# Patient Record
Sex: Female | Born: 1965 | Race: White | Hispanic: No | Marital: Married | Smoking: Never smoker
Health system: Southern US, Community
[De-identification: ages and names within clinical notes are randomized; demographics above are authoritative.]

## PROBLEM LIST (undated history)

## (undated) DIAGNOSIS — F419 Anxiety disorder, unspecified: Secondary | ICD-10-CM

## (undated) DIAGNOSIS — E7800 Pure hypercholesterolemia, unspecified: Secondary | ICD-10-CM

## (undated) DIAGNOSIS — H538 Other visual disturbances: Secondary | ICD-10-CM

## (undated) DIAGNOSIS — R42 Dizziness and giddiness: Secondary | ICD-10-CM

## (undated) DIAGNOSIS — R740 Nonspecific elevation of levels of transaminase and lactic acid dehydrogenase [LDH]: Secondary | ICD-10-CM

## (undated) HISTORY — DX: Nonspecific elevation of levels of transaminase and lactic acid dehydrogenase (ldh): R74.0

## (undated) HISTORY — DX: Pure hypercholesterolemia, unspecified: E78.00

## (undated) HISTORY — DX: Anxiety disorder, unspecified: F41.9

## (undated) HISTORY — PX: AUGMENTATION MAMMAPLASTY: SUR837

## (undated) HISTORY — DX: Other visual disturbances: H53.8

## (undated) HISTORY — DX: Dizziness and giddiness: R42

## (undated) HISTORY — PX: KNEE ARTHROSCOPY: SUR90

---

## 2018-01-20 ENCOUNTER — Encounter: Payer: Self-pay | Admitting: Obstetrics and Gynecology

## 2018-02-10 DIAGNOSIS — R7401 Elevation of levels of liver transaminase levels: Secondary | ICD-10-CM

## 2018-02-10 HISTORY — DX: Elevation of levels of liver transaminase levels: R74.01

## 2018-03-01 ENCOUNTER — Encounter: Payer: Self-pay | Admitting: Obstetrics and Gynecology

## 2018-03-03 ENCOUNTER — Other Ambulatory Visit: Payer: Self-pay

## 2018-03-03 ENCOUNTER — Encounter: Payer: Self-pay | Admitting: Obstetrics and Gynecology

## 2018-03-03 ENCOUNTER — Ambulatory Visit (INDEPENDENT_AMBULATORY_CARE_PROVIDER_SITE_OTHER): Payer: Commercial Managed Care - PPO | Admitting: Obstetrics and Gynecology

## 2018-03-03 VITALS — BP 100/62 | HR 70 | Resp 16 | Wt 129.0 lb

## 2018-03-03 DIAGNOSIS — R740 Nonspecific elevation of levels of transaminase and lactic acid dehydrogenase [LDH]: Secondary | ICD-10-CM | POA: Diagnosis not present

## 2018-03-03 DIAGNOSIS — Z23 Encounter for immunization: Secondary | ICD-10-CM | POA: Diagnosis not present

## 2018-03-03 DIAGNOSIS — Z01419 Encounter for gynecological examination (general) (routine) without abnormal findings: Secondary | ICD-10-CM | POA: Diagnosis not present

## 2018-03-03 DIAGNOSIS — R7401 Elevation of levels of liver transaminase levels: Secondary | ICD-10-CM

## 2018-03-03 NOTE — Patient Instructions (Signed)

## 2018-03-03 NOTE — Progress Notes (Signed)
53 y.o. G2P2 Married Caucasian/Brazilian female here to establish care.    Patient complaining of hair loss, hot flashes, mood swings and decreased libido. Taking biotin, and hair improved.  Did IM implant of hormone and felt very anxious months ago.   Had testosterone in it.  She tried a transdermal estrogen patch and had nausea.   Not sleeping well.   No vaginal pain with intercourse.  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   PCP: None    Patient's last menstrual period was 12/11/2017.     Prior LMP 10/2017.    Sexually active: Yes.   female The current method of family planning is none.    Exercising: No.   Smoker:  no  Health Maintenance: Pap:  10/2017 normal per patient - Dr. Shellee Milo. History of abnormal Pap:  no MMG:  02/2017 normal per patient Colonoscopy:  2017 polyps;next due 5 years BMD:   n/a  Result  n/a TDaP:  Unsure. Thinks it is at least 9 years or more.  Gardasil:   no HIV: 5 years ago--Neg Hep C: 5 years ago--Neg Screening Labs:  ---- Flu vaccine:  Done.    reports that she has never smoked. She has never used smokeless tobacco. She reports current alcohol use of about 3.0 standard drinks of alcohol per week. She reports that she does not use drugs.  History reviewed. No pertinent past medical history.  Past Surgical History:  Procedure Laterality Date  . AUGMENTATION MAMMAPLASTY     Silicone implants  . KNEE ARTHROSCOPY Left     Current Outpatient Medications  Medication Sig Dispense Refill  . Biotin 1 MG CAPS Take 1 capsule by mouth daily.    . Multiple Vitamin (MULTIVITAMIN) capsule Take 1 capsule by mouth daily.     No current facility-administered medications for this visit.     Family History  Problem Relation Age of Onset  . Diabetes Sister     Review of Systems  All other systems reviewed and are negative.   Exam:   BP 100/62 (BP Location: Right Arm, Patient Position: Sitting, Cuff Size: Normal)   Pulse 70   Resp 16   Wt  129 lb (58.5 kg)   LMP 12/11/2017     General appearance: alert, cooperative and appears stated age Head: Normocephalic, without obvious abnormality, atraumatic Neck: no adenopathy, supple, symmetrical, trachea midline and thyroid normal to inspection and palpation Lungs: clear to auscultation bilaterally Breasts: consistent with bilateral implants, normal appearance, no masses or tenderness, No nipple retraction or dimpling, No nipple discharge or bleeding, No axillary or supraclavicular adenopathy Heart: regular rate and rhythm Abdomen: soft, non-tender; no masses, no organomegaly Extremities: extremities normal, atraumatic, no cyanosis or edema Skin: Skin color, texture, turgor normal. No rashes or lesions Lymph nodes: Cervical, supraclavicular, and axillary nodes normal. No abnormal inguinal nodes palpated Neurologic: Grossly normal  Pelvic: External genitalia:  no lesions              Urethra:  normal appearing urethra with no masses, tenderness or lesions              Bartholins and Skenes: normal                 Vagina: normal appearing vagina with normal color and discharge, no lesions              Cervix: no lesions              Pap taken: No. Bimanual Exam:  Uterus:  normal size, contour, position, consistency, mobility, non-tender              Adnexa: no mass, fullness, tenderness              Rectal exam: Yes.  .  Confirms.              Anus:  normal sphincter tone, no lesions  Chaperone was present for exam.  Assessment:   Well woman visit with normal exam. Menopausal symptoms.  Intolerance to hormonal therapies.  Breast implants.   Plan: Mammogram screening. Recommended self breast awareness. Pap and HR HPV as above. Guidelines for Calcium, Vitamin D, regular exercise program including cardiovascular and weight bearing exercise. She will return to her exercise routine.  Routine labs.  I discussed menopause and gave her a pamphlet about this.  She may try  Estroven.  Follow up annually and prn.  After visit summary provided.

## 2018-03-04 LAB — COMPREHENSIVE METABOLIC PANEL
ALBUMIN: 4.6 g/dL (ref 3.8–4.9)
ALT: 38 IU/L — ABNORMAL HIGH (ref 0–32)
AST: 33 IU/L (ref 0–40)
Albumin/Globulin Ratio: 1.9 (ref 1.2–2.2)
Alkaline Phosphatase: 80 IU/L (ref 39–117)
BUN / CREAT RATIO: 19 (ref 9–23)
BUN: 14 mg/dL (ref 6–24)
Bilirubin Total: 0.4 mg/dL (ref 0.0–1.2)
CO2: 20 mmol/L (ref 20–29)
Calcium: 9.9 mg/dL (ref 8.7–10.2)
Chloride: 102 mmol/L (ref 96–106)
Creatinine, Ser: 0.72 mg/dL (ref 0.57–1.00)
GFR calc Af Amer: 111 mL/min/{1.73_m2} (ref >59)
GFR calc non Af Amer: 97 mL/min/{1.73_m2} (ref >59)
GLUCOSE: 87 mg/dL (ref 65–99)
Globulin, Total: 2.4 g/dL (ref 1.5–4.5)
Potassium: 4.3 mmol/L (ref 3.5–5.2)
Sodium: 141 mmol/L (ref 134–144)
Total Protein: 7 g/dL (ref 6.0–8.5)

## 2018-03-04 LAB — LIPID PANEL
Chol/HDL Ratio: 1.9 ratio (ref 0.0–4.4)
Cholesterol, Total: 210 mg/dL — ABNORMAL HIGH (ref 100–199)
HDL: 108 mg/dL (ref >39)
LDL Calculated: 84 mg/dL (ref 0–99)
Triglycerides: 92 mg/dL (ref 0–149)
VLDL Cholesterol Cal: 18 mg/dL (ref 5–40)

## 2018-03-04 LAB — CBC
Hematocrit: 40.8 % (ref 34.0–46.6)
Hemoglobin: 14.2 g/dL (ref 11.1–15.9)
MCH: 32.5 pg (ref 26.6–33.0)
MCHC: 34.8 g/dL (ref 31.5–35.7)
MCV: 93 fL (ref 79–97)
Platelets: 319 10*3/uL (ref 150–450)
RBC: 4.37 x10E6/uL (ref 3.77–5.28)
RDW: 12.3 % (ref 11.7–15.4)
WBC: 6.6 10*3/uL (ref 3.4–10.8)

## 2018-03-04 LAB — TSH: TSH: 2.23 u[IU]/mL (ref 0.450–4.500)

## 2018-03-04 LAB — VITAMIN D 25 HYDROXY (VIT D DEFICIENCY, FRACTURES): Vit D, 25-Hydroxy: 54.8 ng/mL (ref 30.0–100.0)

## 2018-03-05 ENCOUNTER — Encounter: Payer: Self-pay | Admitting: Obstetrics and Gynecology

## 2018-03-05 NOTE — Addendum Note (Signed)
Addended by: Ardell Isaacs, Lavine Hargrove E on: 03/05/2018 05:30 AM   Modules accepted: Orders

## 2018-03-08 ENCOUNTER — Telehealth: Payer: Self-pay | Admitting: Obstetrics and Gynecology

## 2018-03-08 NOTE — Telephone Encounter (Signed)
-----   Message from Patton Salles, MD sent at 03/05/2018  5:29 AM EST ----- Please report results to patient.   Cholesterol is elevated due to her HDL, good cholesterol, being so high.  Cholesterol ratios are excellent.   Her AST liver enzyme is elevated and needs a repeat in 6 weeks.  She may be able to lower this by reducing alcohol or Tylenol use.   Her vit D, thyroid, and blood counts are normal. I will place an order, and please schedule a lab visit.   I am highlighting this result so you know to contact the patient.

## 2018-03-08 NOTE — Telephone Encounter (Signed)
Patient saw her lab results in MyChart and has questions.

## 2018-03-08 NOTE — Telephone Encounter (Signed)
Spoke with patient, advised as seen below per Dr. Edward Jolly. Lab appt scheduled for 3*6/20 at 3:30pm.  Patient verbalizes understanding and is agreeable.   Encounter closed.

## 2018-03-08 NOTE — Telephone Encounter (Signed)
Patient is calling regarding results.

## 2018-04-05 ENCOUNTER — Telehealth: Payer: Self-pay | Admitting: Obstetrics and Gynecology

## 2018-04-05 NOTE — Telephone Encounter (Signed)
Left message to call Arriel Victor, RN at [facility] [phone].   

## 2018-04-05 NOTE — Telephone Encounter (Signed)
Spoke with patient. Patient reports brown vaginal d/c that started on 2/23. Patient is postmenopausal. Denies heavy bleeding, pelvic pain, N/V, fever/chills, urinary symptoms or vaginal odor. Currently out of town, will return Thursday night. Recommended OV for further evaluation, patient declined OV on 2/28, states she will be traveling to [place] that day. OV scheduled for 3/2 at 3:30pm with Dr. Edward Jolly.   Routing to provider for final review. Patient is agreeable to disposition. Will close encounter.

## 2018-04-05 NOTE — Telephone Encounter (Signed)
Patient called and said she is experiencing some "brown" bleeding vaginally and she is post menopausal.   Last seen: 03/03/18

## 2018-04-05 NOTE — Telephone Encounter (Signed)
Patient returned call to nurse Jill. °

## 2018-04-12 ENCOUNTER — Ambulatory Visit: Payer: Self-pay | Admitting: Obstetrics and Gynecology

## 2018-04-12 ENCOUNTER — Encounter: Payer: Self-pay | Admitting: Obstetrics and Gynecology

## 2018-04-12 ENCOUNTER — Telehealth: Payer: Self-pay | Admitting: Obstetrics and Gynecology

## 2018-04-12 NOTE — Telephone Encounter (Signed)
Called patient about missed appointment today. No answer and voicemail is full.

## 2018-04-12 NOTE — Progress Notes (Deleted)
GYNECOLOGY  VISIT   HPI: 53 y.o.   Married  Caucasian/Brazilian  female   G2P2 with No LMP recorded.   here for postmenopausal bleeding.     GYNECOLOGIC HISTORY: No LMP recorded. Contraception:  none Menopausal hormone therapy:  none Last mammogram: 02/2017 normal per patient Last pap smear:  10/2017 normal per patient - Dr.Rivaard        OB History    Gravida  2   Para  2   Term      Preterm      AB      Living  2     SAB      TAB      Ectopic      Multiple      Live Births                 There are no active problems to display for this patient.   Past Medical History:  Diagnosis Date  . Elevated ALT measurement 2020    Past Surgical History:  Procedure Laterality Date  . AUGMENTATION MAMMAPLASTY     Silicone implants  . KNEE ARTHROSCOPY Left     Current Outpatient Medications  Medication Sig Dispense Refill  . Biotin 1 MG CAPS Take 1 capsule by mouth daily.    . Multiple Vitamin (MULTIVITAMIN) capsule Take 1 capsule by mouth daily.     No current facility-administered medications for this visit.      ALLERGIES: Patient has no known allergies.  Family History  Problem Relation Age of Onset  . Diabetes Sister     Social History   Socioeconomic History  . Marital status: Married    Spouse name: Not on file  . Number of children: Not on file  . Years of education: Not on file  . Highest education level: Not on file  Occupational History  . Not on file  Social Needs  . Financial resource strain: Not on file  . Food insecurity:    Worry: Not on file    Inability: Not on file  . Transportation needs:    Medical: Not on file    Non-medical: Not on file  Tobacco Use  . Smoking status: Never Smoker  . Smokeless tobacco: Never Used  Substance and Sexual Activity  . Alcohol use: Yes    Alcohol/week: 3.0 standard drinks    Types: 3 Glasses of wine per week  . Drug use: Never  . Sexual activity: Yes    Birth control/protection:  None  Lifestyle  . Physical activity:    Days per week: Not on file    Minutes per session: Not on file  . Stress: Not on file  Relationships  . Social connections:    Talks on phone: Not on file    Gets together: Not on file    Attends religious service: Not on file    Active member of club or organization: Not on file    Attends meetings of clubs or organizations: Not on file    Relationship status: Not on file  . Intimate partner violence:    Fear of current or ex partner: Not on file    Emotionally abused: Not on file    Physically abused: Not on file    Forced sexual activity: Not on file  Other Topics Concern  . Not on file  Social History Narrative  . Not on file    Review of Systems  PHYSICAL EXAMINATION:    There  were no vitals taken for this visit.    General appearance: alert, cooperative and appears stated age Head: Normocephalic, without obvious abnormality, atraumatic Neck: no adenopathy, supple, symmetrical, trachea midline and thyroid normal to inspection and palpation Lungs: clear to auscultation bilaterally Breasts: normal appearance, no masses or tenderness, No nipple retraction or dimpling, No nipple discharge or bleeding, No axillary or supraclavicular adenopathy Heart: regular rate and rhythm Abdomen: soft, non-tender, no masses,  no organomegaly Extremities: extremities normal, atraumatic, no cyanosis or edema Skin: Skin color, texture, turgor normal. No rashes or lesions Lymph nodes: Cervical, supraclavicular, and axillary nodes normal. No abnormal inguinal nodes palpated Neurologic: Grossly normal  Pelvic: External genitalia:  no lesions              Urethra:  normal appearing urethra with no masses, tenderness or lesions              Bartholins and Skenes: normal                 Vagina: normal appearing vagina with normal color and discharge, no lesions              Cervix: no lesions                Bimanual Exam:  Uterus:  normal size,  contour, position, consistency, mobility, non-tender              Adnexa: no mass, fullness, tenderness              Rectal exam: {yes no:314532}.  Confirms.              Anus:  normal sphincter tone, no lesions  Chaperone was present for exam.  ASSESSMENT     PLAN     An After Visit Summary was printed and given to the patient.  ______ minutes face to face time of which over 50% was spent in counseling.

## 2018-04-12 NOTE — Telephone Encounter (Signed)
Patient called regarding her missed appointment today. She had a dead battery in her car and unable to make it to her appointment. She did reschedule to 04/15/18.

## 2018-04-13 ENCOUNTER — Other Ambulatory Visit: Payer: Commercial Managed Care - PPO

## 2018-04-15 ENCOUNTER — Ambulatory Visit: Payer: Self-pay | Admitting: Obstetrics and Gynecology

## 2018-04-15 NOTE — Progress Notes (Deleted)
GYNECOLOGY  VISIT   HPI: 53 y.o.   Married  Caucasian  female   G2P2 with No LMP recorded.   here for     GYNECOLOGIC HISTORY: No LMP recorded. Contraception:  *** Menopausal hormone therapy:  none Last mammogram:  02/2017 normal per patient Last pap smear: 10/2017 normal per patient - Dr. Shellee Milo        OB History    Gravida  2   Para  2   Term      Preterm      AB      Living  2     SAB      TAB      Ectopic      Multiple      Live Births                 There are no active problems to display for this patient.   Past Medical History:  Diagnosis Date  . Elevated ALT measurement 2020    Past Surgical History:  Procedure Laterality Date  . AUGMENTATION MAMMAPLASTY     Silicone implants  . KNEE ARTHROSCOPY Left     Current Outpatient Medications  Medication Sig Dispense Refill  . Biotin 1 MG CAPS Take 1 capsule by mouth daily.    . Multiple Vitamin (MULTIVITAMIN) capsule Take 1 capsule by mouth daily.     No current facility-administered medications for this visit.      ALLERGIES: Patient has no known allergies.  Family History  Problem Relation Age of Onset  . Diabetes Sister     Social History   Socioeconomic History  . Marital status: Married    Spouse name: Not on file  . Number of children: Not on file  . Years of education: Not on file  . Highest education level: Not on file  Occupational History  . Not on file  Social Needs  . Financial resource strain: Not on file  . Food insecurity:    Worry: Not on file    Inability: Not on file  . Transportation needs:    Medical: Not on file    Non-medical: Not on file  Tobacco Use  . Smoking status: Never Smoker  . Smokeless tobacco: Never Used  Substance and Sexual Activity  . Alcohol use: Yes    Alcohol/week: 3.0 standard drinks    Types: 3 Glasses of wine per week  . Drug use: Never  . Sexual activity: Yes    Birth control/protection: None  Lifestyle  . Physical  activity:    Days per week: Not on file    Minutes per session: Not on file  . Stress: Not on file  Relationships  . Social connections:    Talks on phone: Not on file    Gets together: Not on file    Attends religious service: Not on file    Active member of club or organization: Not on file    Attends meetings of clubs or organizations: Not on file    Relationship status: Not on file  . Intimate partner violence:    Fear of current or ex partner: Not on file    Emotionally abused: Not on file    Physically abused: Not on file    Forced sexual activity: Not on file  Other Topics Concern  . Not on file  Social History Narrative  . Not on file    Review of Systems  PHYSICAL EXAMINATION:    There were  no vitals taken for this visit.    General appearance: alert, cooperative and appears stated age Head: Normocephalic, without obvious abnormality, atraumatic Neck: no adenopathy, supple, symmetrical, trachea midline and thyroid normal to inspection and palpation Lungs: clear to auscultation bilaterally Breasts: normal appearance, no masses or tenderness, No nipple retraction or dimpling, No nipple discharge or bleeding, No axillary or supraclavicular adenopathy Heart: regular rate and rhythm Abdomen: soft, non-tender, no masses,  no organomegaly Extremities: extremities normal, atraumatic, no cyanosis or edema Skin: Skin color, texture, turgor normal. No rashes or lesions Lymph nodes: Cervical, supraclavicular, and axillary nodes normal. No abnormal inguinal nodes palpated Neurologic: Grossly normal  Pelvic: External genitalia:  no lesions              Urethra:  normal appearing urethra with no masses, tenderness or lesions              Bartholins and Skenes: normal                 Vagina: normal appearing vagina with normal color and discharge, no lesions              Cervix: no lesions                Bimanual Exam:  Uterus:  normal size, contour, position, consistency,  mobility, non-tender              Adnexa: no mass, fullness, tenderness              Rectal exam: {yes no:314532}.  Confirms.              Anus:  normal sphincter tone, no lesions  Chaperone was present for exam.  ASSESSMENT     PLAN     An After Visit Summary was printed and given to the patient.  ______ minutes face to face time of which over 50% was spent in counseling.

## 2018-04-16 ENCOUNTER — Other Ambulatory Visit: Payer: Self-pay

## 2018-04-16 ENCOUNTER — Ambulatory Visit: Payer: Self-pay | Admitting: Obstetrics and Gynecology

## 2018-04-23 ENCOUNTER — Telehealth: Payer: Self-pay | Admitting: Obstetrics and Gynecology

## 2018-04-23 ENCOUNTER — Ambulatory Visit: Payer: Commercial Managed Care - PPO | Admitting: Obstetrics and Gynecology

## 2018-04-23 ENCOUNTER — Encounter: Payer: Self-pay | Admitting: Obstetrics and Gynecology

## 2018-04-23 NOTE — Progress Notes (Deleted)
GYNECOLOGY  VISIT   HPI: 53 y.o.   Married  Caucasian  female   G2P2 with No LMP recorded.   here for PMB    GYNECOLOGIC HISTORY: No LMP recorded. Contraception:  none Menopausal hormone therapy:  none Last mammogram:  02/2017 normal per patient Last pap smear:   10/2017 normal per patient - Dr. Shellee Milo        OB History    Gravida  2   Para  2   Term      Preterm      AB      Living  2     SAB      TAB      Ectopic      Multiple      Live Births                 There are no active problems to display for this patient.   Past Medical History:  Diagnosis Date  . Elevated ALT measurement 2020    Past Surgical History:  Procedure Laterality Date  . AUGMENTATION MAMMAPLASTY     Silicone implants  . KNEE ARTHROSCOPY Left     Current Outpatient Medications  Medication Sig Dispense Refill  . Biotin 1 MG CAPS Take 1 capsule by mouth daily.    . Multiple Vitamin (MULTIVITAMIN) capsule Take 1 capsule by mouth daily.     No current facility-administered medications for this visit.      ALLERGIES: Patient has no known allergies.  Family History  Problem Relation Age of Onset  . Diabetes Sister     Social History   Socioeconomic History  . Marital status: Married    Spouse name: Not on file  . Number of children: Not on file  . Years of education: Not on file  . Highest education level: Not on file  Occupational History  . Not on file  Social Needs  . Financial resource strain: Not on file  . Food insecurity:    Worry: Not on file    Inability: Not on file  . Transportation needs:    Medical: Not on file    Non-medical: Not on file  Tobacco Use  . Smoking status: Never Smoker  . Smokeless tobacco: Never Used  Substance and Sexual Activity  . Alcohol use: Yes    Alcohol/week: 3.0 standard drinks    Types: 3 Glasses of wine per week  . Drug use: Never  . Sexual activity: Yes    Birth control/protection: None  Lifestyle  . Physical  activity:    Days per week: Not on file    Minutes per session: Not on file  . Stress: Not on file  Relationships  . Social connections:    Talks on phone: Not on file    Gets together: Not on file    Attends religious service: Not on file    Active member of club or organization: Not on file    Attends meetings of clubs or organizations: Not on file    Relationship status: Not on file  . Intimate partner violence:    Fear of current or ex partner: Not on file    Emotionally abused: Not on file    Physically abused: Not on file    Forced sexual activity: Not on file  Other Topics Concern  . Not on file  Social History Narrative  . Not on file    Review of Systems  PHYSICAL EXAMINATION:  There were no vitals taken for this visit.    General appearance: alert, cooperative and appears stated age Head: Normocephalic, without obvious abnormality, atraumatic Neck: no adenopathy, supple, symmetrical, trachea midline and thyroid normal to inspection and palpation Lungs: clear to auscultation bilaterally Breasts: normal appearance, no masses or tenderness, No nipple retraction or dimpling, No nipple discharge or bleeding, No axillary or supraclavicular adenopathy Heart: regular rate and rhythm Abdomen: soft, non-tender, no masses,  no organomegaly Extremities: extremities normal, atraumatic, no cyanosis or edema Skin: Skin color, texture, turgor normal. No rashes or lesions Lymph nodes: Cervical, supraclavicular, and axillary nodes normal. No abnormal inguinal nodes palpated Neurologic: Grossly normal  Pelvic: External genitalia:  no lesions              Urethra:  normal appearing urethra with no masses, tenderness or lesions              Bartholins and Skenes: normal                 Vagina: normal appearing vagina with normal color and discharge, no lesions              Cervix: no lesions                Bimanual Exam:  Uterus:  normal size, contour, position, consistency,  mobility, non-tender              Adnexa: no mass, fullness, tenderness              Rectal exam: {yes no:314532}.  Confirms.              Anus:  normal sphincter tone, no lesions  Chaperone was present for exam.  ASSESSMENT     PLAN     An After Visit Summary was printed and given to the patient.  ______ minutes face to face time of which over 50% was spent in counseling.

## 2018-04-23 NOTE — Telephone Encounter (Signed)
Patient canceled her a appointment today for  hepatic panel and  PMB, brown d/c. Patient stated it would be impossible for her to make this afternoon. She said "I am no longer having this problem and will call if I need to return".

## 2018-06-23 ENCOUNTER — Other Ambulatory Visit: Payer: Self-pay | Admitting: Nurse Practitioner

## 2018-06-23 DIAGNOSIS — Z1231 Encounter for screening mammogram for malignant neoplasm of breast: Secondary | ICD-10-CM

## 2018-06-23 NOTE — Telephone Encounter (Signed)
Please see staff message

## 2018-06-30 ENCOUNTER — Ambulatory Visit
Admission: RE | Admit: 2018-06-30 | Discharge: 2018-06-30 | Disposition: A | Discharge status: Home / Self Care | Payer: Commercial Managed Care - PPO | Source: Ambulatory Visit | Attending: Nurse Practitioner | Admitting: Nurse Practitioner

## 2018-06-30 ENCOUNTER — Other Ambulatory Visit: Payer: Self-pay

## 2018-06-30 ENCOUNTER — Encounter (INDEPENDENT_AMBULATORY_CARE_PROVIDER_SITE_OTHER): Payer: Self-pay

## 2018-06-30 DIAGNOSIS — Z1231 Encounter for screening mammogram for malignant neoplasm of breast: Secondary | ICD-10-CM

## 2018-06-30 IMAGING — MG DIGITAL SCREENING BILATERAL MAMMOGRAM WITH IMPLANTS, CAD AND TOM
8 of 12 series · 8 of 28 positions shown · non-contrast
Comparison: None

CLINICAL DATA: Screening.

EXAM:
DIGITAL SCREENING BILATERAL MAMMOGRAM WITH IMPLANTS, CAD AND TOMO
The patient has retropectoral implants. Standard and implant
displaced views were performed.

[L CC]
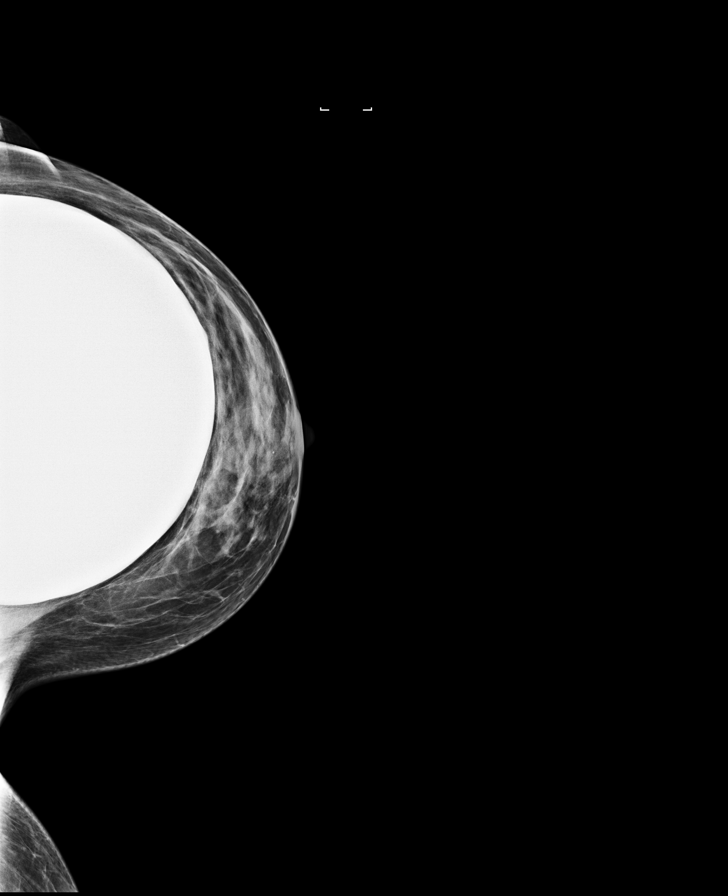

[R MLO]
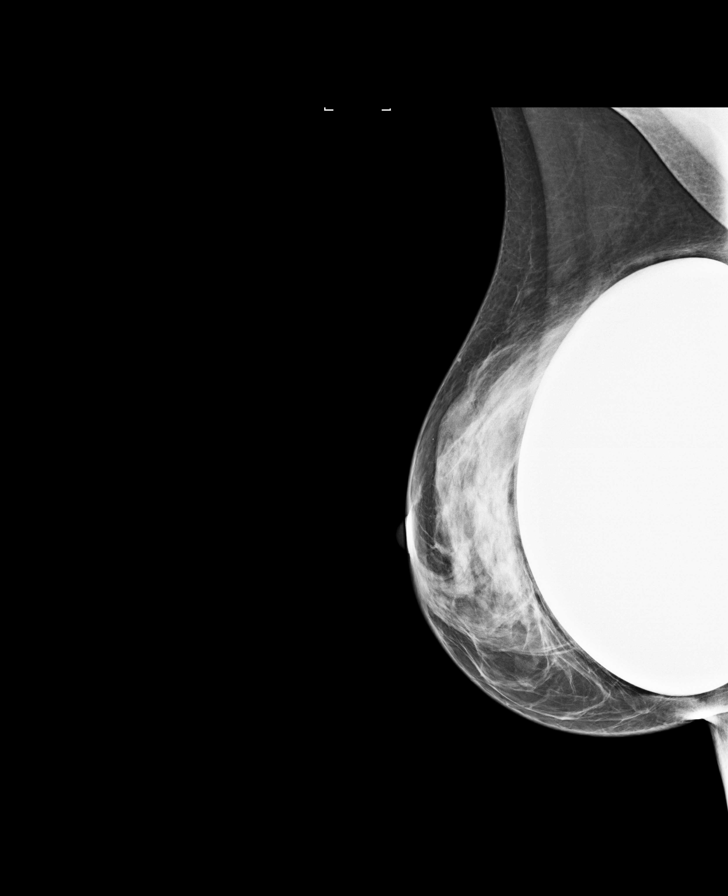

[R CC]
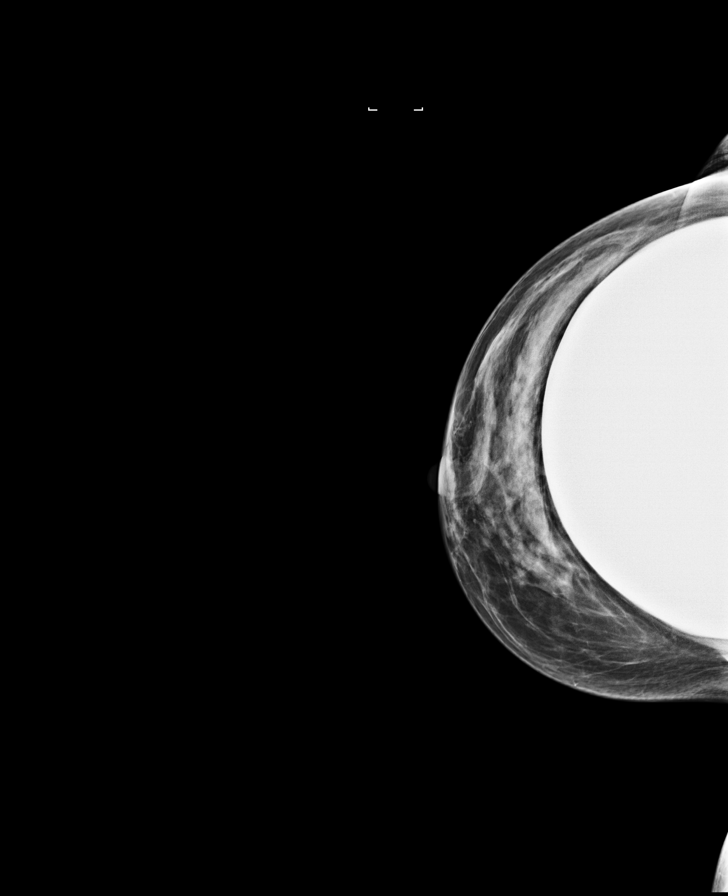

[L MLO]
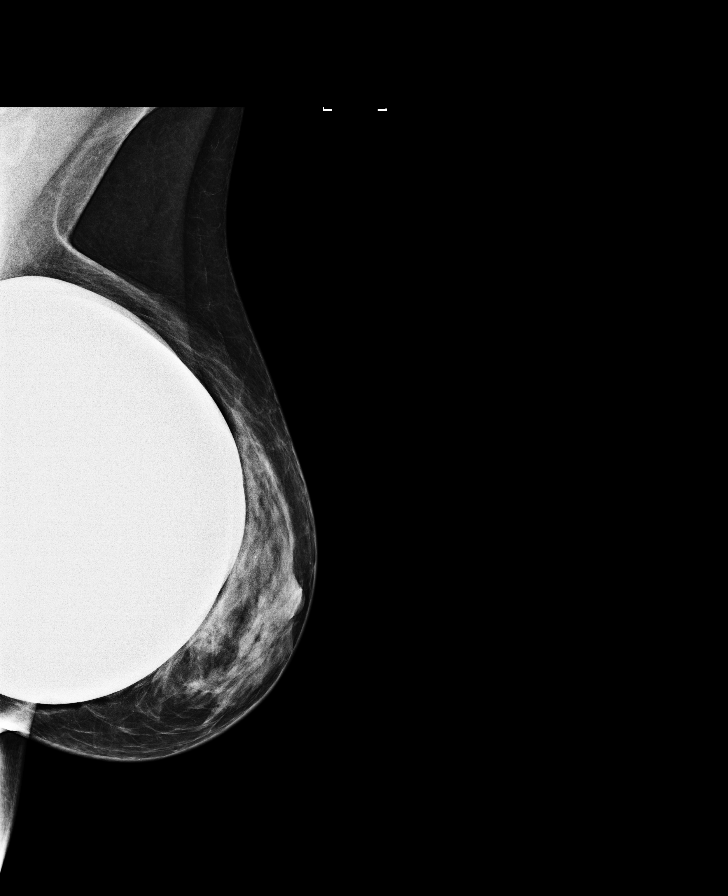

[R MLO synth-2D]
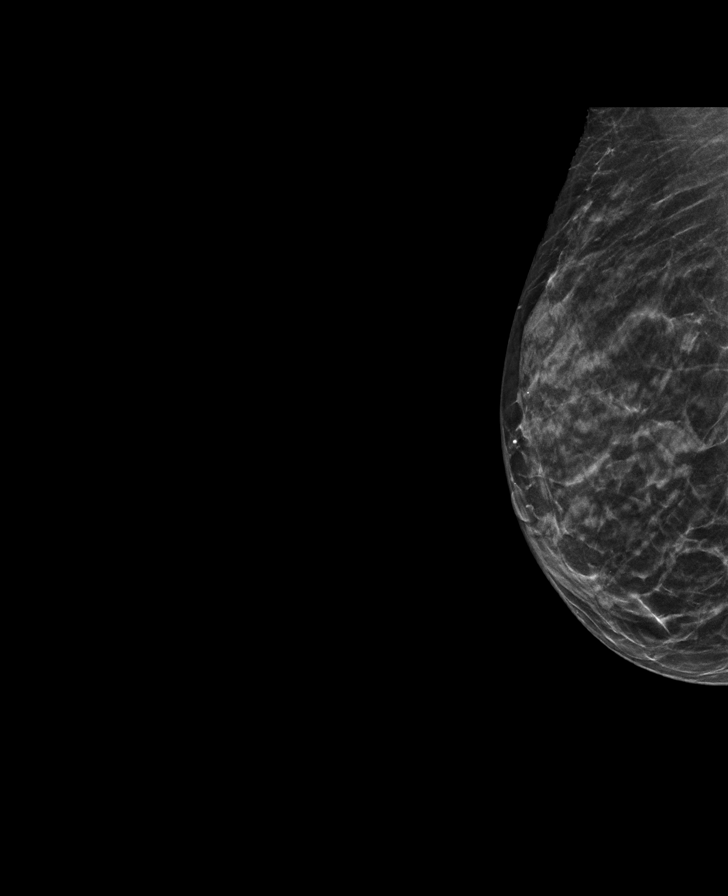

[L CC synth-2D]
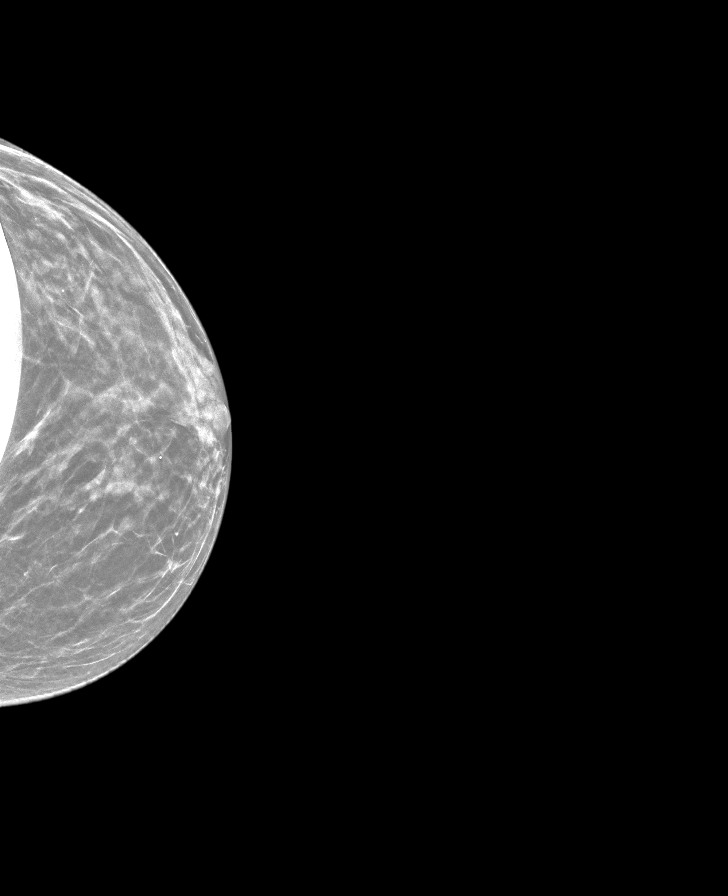

[R CC synth-2D]
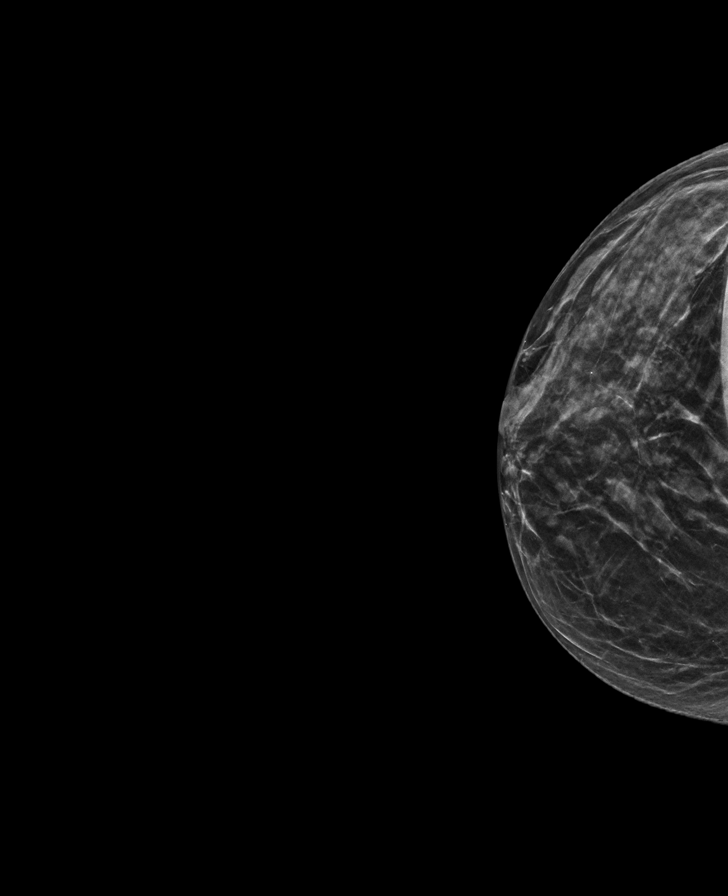

[L MLO synth-2D]
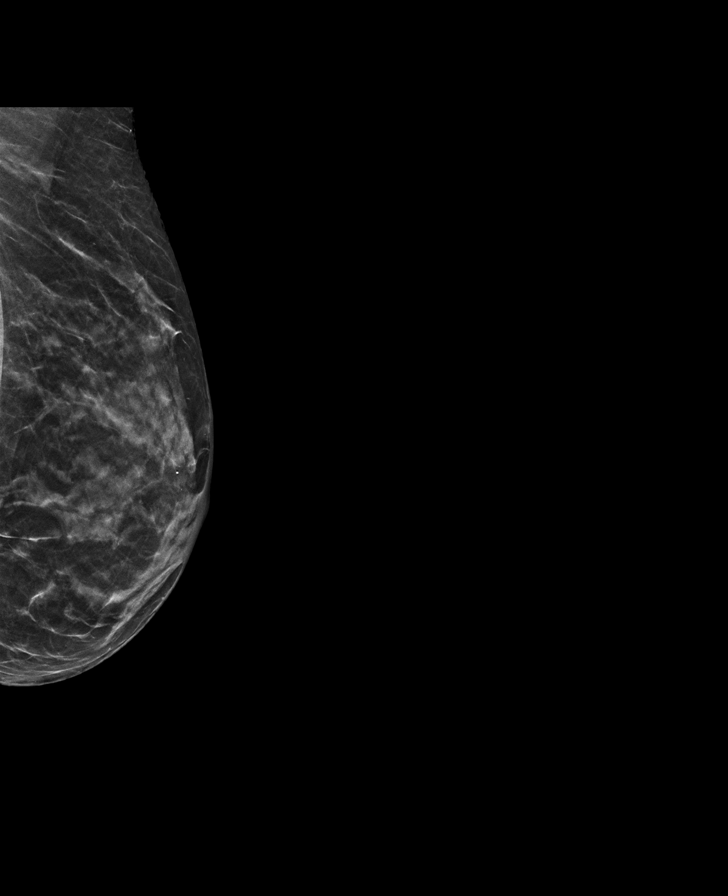

[8 of 28 positions shown; findings below may reference images not displayed]

ACR Breast Density Category b: There are scattered areas of
fibroglandular density.
FINDINGS: There are no findings suspicious for malignancy. Images were
processed with CAD.
IMPRESSION: No mammographic evidence of malignancy. A result letter of this
screening mammogram will be mailed directly to the patient.

RECOMMENDATION:
Screening mammogram in one year. (Code:[7R])

BI-RADS CATEGORY  1:  Negative.

## 2018-07-20 ENCOUNTER — Encounter: Payer: Self-pay | Admitting: *Deleted

## 2018-07-21 ENCOUNTER — Telehealth: Payer: Self-pay | Admitting: *Deleted

## 2018-07-21 NOTE — Telephone Encounter (Signed)
Patient received My Chart notification of canceled appointment and called to find out why appointment was cancelled.  Advised letter has been mailed and will send in My Chart.  Reviewed policy as stated in letter.   Gae Dry CMA present for call.  Routing to Dr Quincy Simmonds. Encounter closed.

## 2018-07-22 NOTE — Telephone Encounter (Signed)
Letter has been sent to patient. Encounter closed.

## 2018-12-02 ENCOUNTER — Ambulatory Visit (INDEPENDENT_AMBULATORY_CARE_PROVIDER_SITE_OTHER): Payer: Commercial Managed Care - PPO | Admitting: Family Medicine

## 2018-12-02 ENCOUNTER — Encounter: Payer: Self-pay | Admitting: Family Medicine

## 2018-12-02 ENCOUNTER — Other Ambulatory Visit: Payer: Self-pay

## 2018-12-02 VITALS — BP 120/70 | HR 65 | Temp 97.7°F | Ht 63.0 in | Wt 131.0 lb

## 2018-12-02 DIAGNOSIS — N951 Menopausal and female climacteric states: Secondary | ICD-10-CM

## 2018-12-02 DIAGNOSIS — R7303 Prediabetes: Secondary | ICD-10-CM | POA: Diagnosis not present

## 2018-12-02 DIAGNOSIS — R42 Dizziness and giddiness: Secondary | ICD-10-CM | POA: Diagnosis not present

## 2018-12-02 NOTE — Patient Instructions (Signed)
Obgyn Offices:   [facility] [address] [place], [place] [address] [phone]  [facility] Address: [address] [place], [place] [address] Phone: [phone]  [facility] [address] [place], [place] [address] Phone: [phone]   [facility] [address] [place], [place] [address] Phone: [phone] 

## 2018-12-02 NOTE — Progress Notes (Signed)
   Subjective:    Patient ID: Jeanette Ross, female    DOB: 27-Feb-1965, 53 y.o.   MRN: [id number]  HPI Chief Complaint  Patient presents with  . vertigo    vertigo x 3 weeks. moving of head and looking down while walking. another docotor gave her meclizine- helped while taking it.    She is new to the practice and states she is here for a second opinion.  Is from [place]. Moved here 1 1/2 years ago.   Her current PCP is at [place], NP  Stats she is having vertigo spells. Has a history of this.  Current dizzy spells started 2 1/2 weeks ago. She has been taking meclizine.  States her PCP checked labs and has ordered a CT head.  Dizziness is associated with position changes only and lasts a few seconds.  Denies fever, chills, headache, vision changes, tinnitus, URI symptoms, chest pain, palpitations, shortness of breath, abdominal pain, N/V/D.   States she was diagnosed with prediabetes with a Hgb A1c of 5.7%. states she was started on Ozempic.   States she was also started on testosterone for hot flashes.   States she is up to date on preventive health care.   Reviewed allergies, medications, past medical, surgical, family, and social history.   Review of Systems Pertinent positives and negatives in the history of present illness.     Objective:   Physical Exam BP 120/70   Pulse 65   Temp 97.7 F (36.5 C)   Ht 5\' 3"  (1.6 m)   Wt 131 lb (59.4 kg)   BMI 23.21 kg/m   Alert and oriented and in no distress. Not otherwise examined.      Assessment & Plan:  Vertigo  Prediabetes  Hot flashes due to menopause  She is new to the practice and here today for second opinion.  She and her husband are currently patients of [facility] primary care.  Reviewed lab results, medications and discussed that I have nothing new to add and agree with the plan to treat her vertigo.  I agree with the CT head to rule out any underlying etiology.   Discussed that she was started on Ozempic for a hemoglobin A1c of 5.7 and that this not necessarily how I would handle a hemoglobin A1c of 5.7 but that this was not necessarily wrong.  Discussed that she should stick with one PCP for continuity of care.  Discussed that I typically do not prescribe testosterone for hot flashes related to menopause and that I would recommend that she schedule with an OB/GYN if she desires to stay on testosterone treatment.  She requested a list of OB/GYN's today and I did provide this.  All questions answered.

## 2018-12-07 ENCOUNTER — Encounter: Payer: Self-pay | Admitting: Family Medicine

## 2018-12-22 ENCOUNTER — Other Ambulatory Visit: Payer: Self-pay | Admitting: Orthopedic Surgery

## 2018-12-22 DIAGNOSIS — M545 Low back pain, unspecified: Secondary | ICD-10-CM

## 2018-12-22 DIAGNOSIS — M542 Cervicalgia: Secondary | ICD-10-CM

## 2018-12-27 ENCOUNTER — Telehealth: Payer: Self-pay | Admitting: Nurse Practitioner

## 2018-12-27 ENCOUNTER — Other Ambulatory Visit: Payer: Self-pay | Admitting: Orthopedic Surgery

## 2018-12-27 DIAGNOSIS — M545 Low back pain, unspecified: Secondary | ICD-10-CM

## 2018-12-27 DIAGNOSIS — M542 Cervicalgia: Secondary | ICD-10-CM

## 2018-12-27 DIAGNOSIS — G8929 Other chronic pain: Secondary | ICD-10-CM

## 2018-12-27 NOTE — Telephone Encounter (Signed)
Phone call to patient to verify medication list and allergies for myelogram procedure. Pt aware she will not need to hold any medications for this procedure. Pre and post procedure instructions reviewed with pt. Pt verbalized understanding. 

## 2018-12-30 ENCOUNTER — Other Ambulatory Visit: Payer: Commercial Managed Care - PPO

## 2019-01-22 ENCOUNTER — Ambulatory Visit
Admission: RE | Admit: 2019-01-22 | Discharge: 2019-01-22 | Disposition: A | Discharge status: Home / Self Care | Payer: Commercial Managed Care - PPO | Source: Ambulatory Visit | Attending: Orthopedic Surgery | Admitting: Orthopedic Surgery

## 2019-01-22 ENCOUNTER — Other Ambulatory Visit: Payer: Self-pay

## 2019-01-22 DIAGNOSIS — M545 Low back pain, unspecified: Secondary | ICD-10-CM

## 2019-01-22 DIAGNOSIS — M542 Cervicalgia: Secondary | ICD-10-CM

## 2019-01-22 IMAGING — MR MR LUMBAR SPINE W/O CM
4 of 5 series · 25 of 48 positions shown · non-contrast
Comparison: None.

CLINICAL DATA: 53-year-old female with 3 months of neck and low
back pain. Symptom onset after [REDACTED]rding. Left hand numbness.
Right leg weakness.

EXAM:
MRI LUMBAR SPINE WITHOUT CONTRAST
TECHNIQUE: Multiplanar, multisequence MR imaging of the lumbar spine was
performed. No intravenous contrast was administered.

[Series 4: T1 · sagittal · 4.0mm · 0.55mm/px · 6 of 13 slices shown (1 of 2)]
[im 1/13]
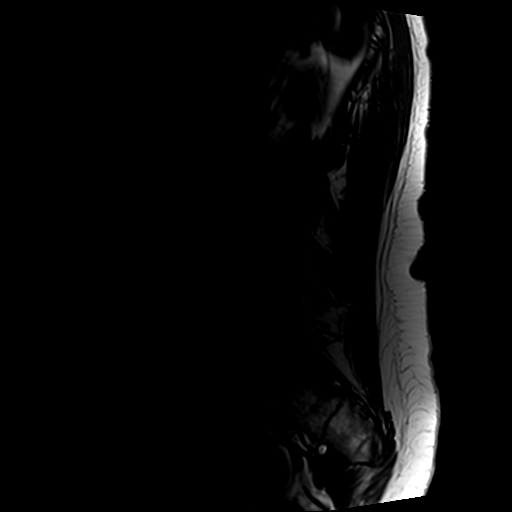
[im 3/13]
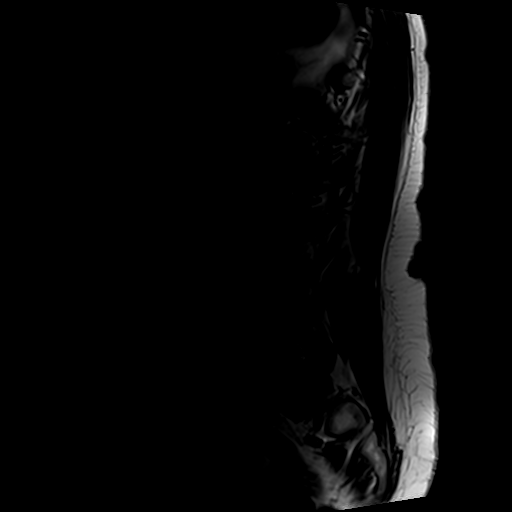
[im 5/13]
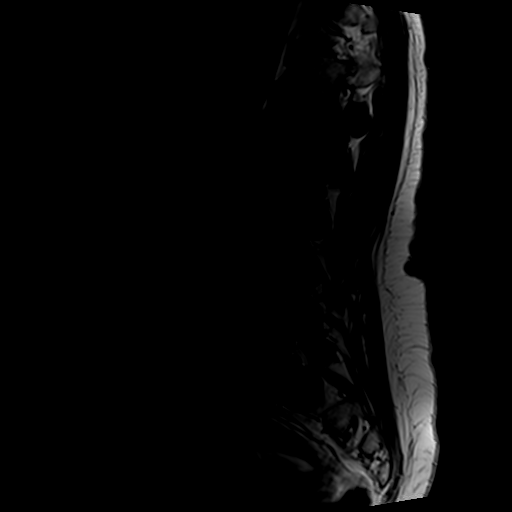
[im 8/13]
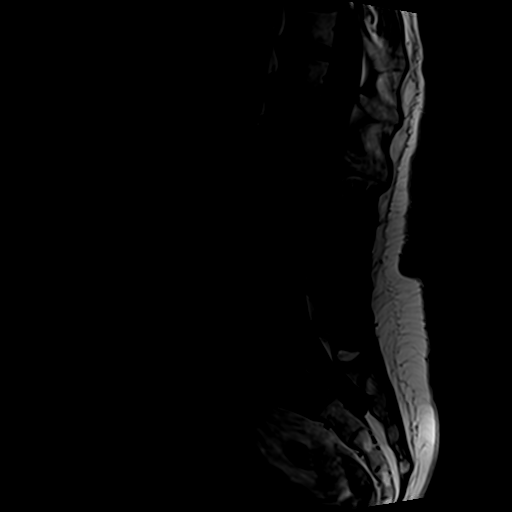
[im 10/13]
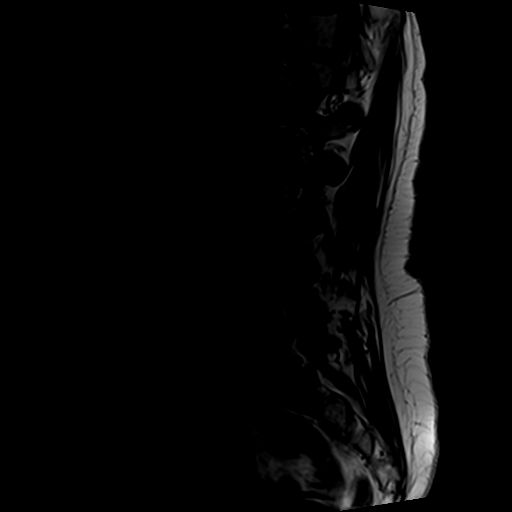
[im 13/13]
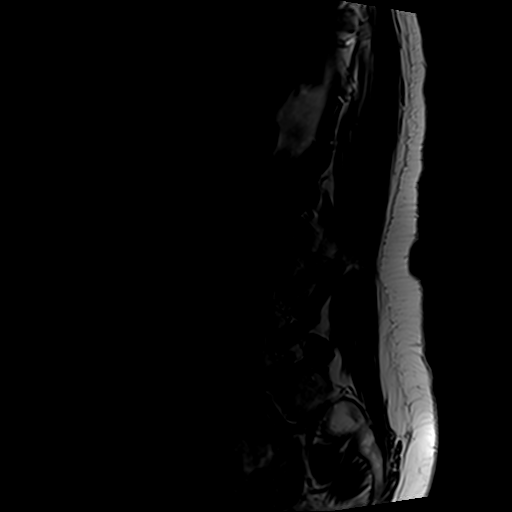

[Series 5: T2 · sagittal · 4.0mm · 0.55mm/px · 6 of 13 slices shown (1 of 2)]
[im 1/13]
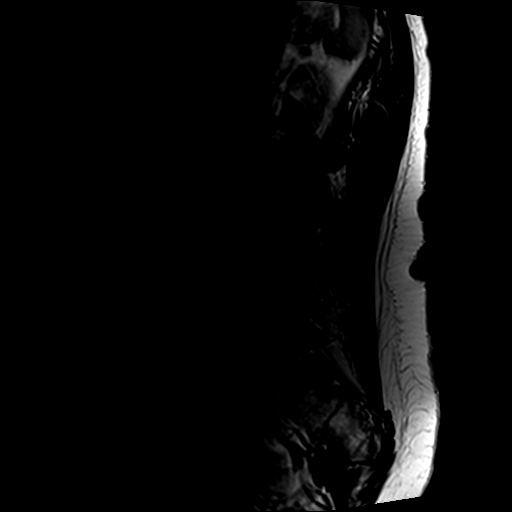
[im 3/13]
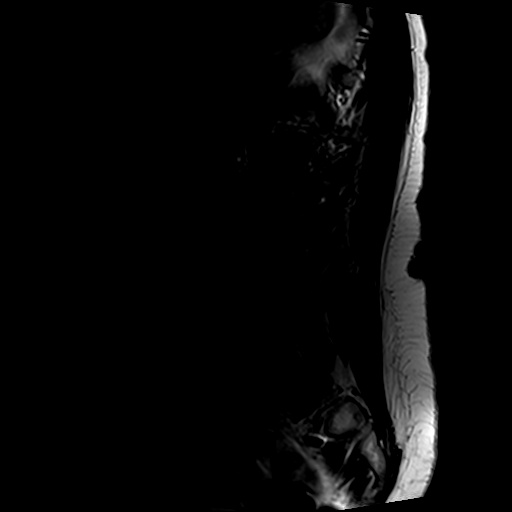
[im 5/13]
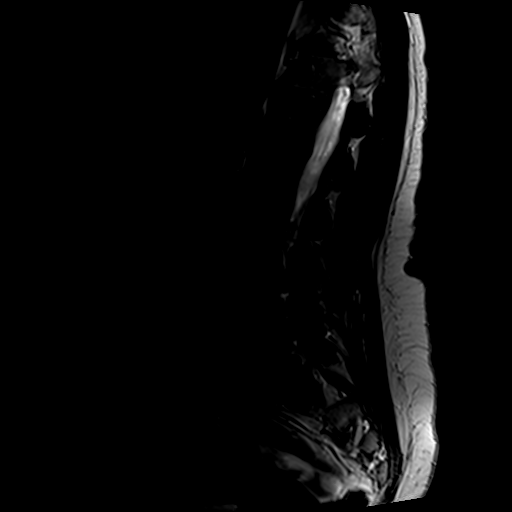
[im 8/13]
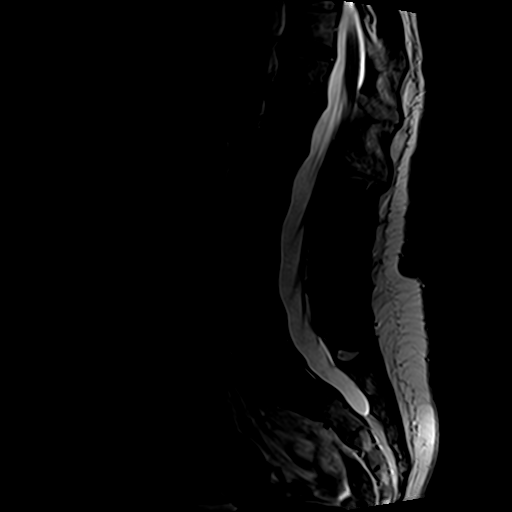
[im 10/13]
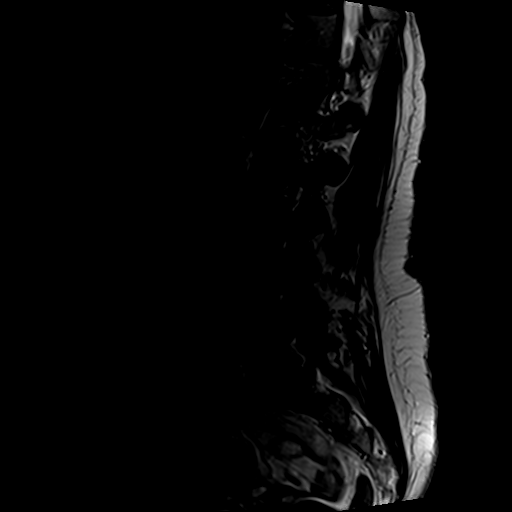
[im 13/13]
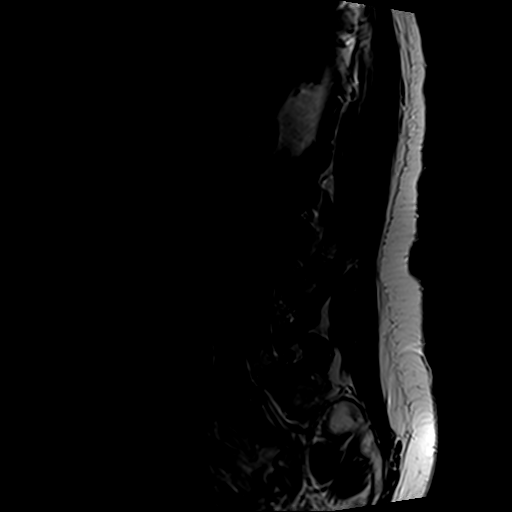

[Series 6: T2 · axial · 4.0mm · 0.70mm/px · z∈[-35,+167]mm · 9 of 35 slices shown (2 of 2)]
[im 1/35]
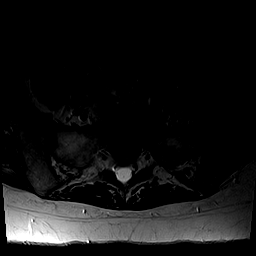
[im 5/35]
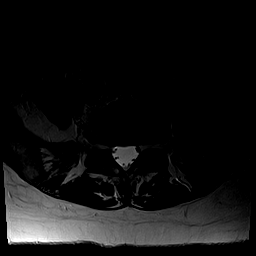
[im 10/35]
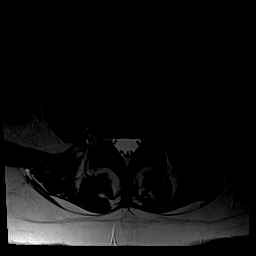
[im 15/35]
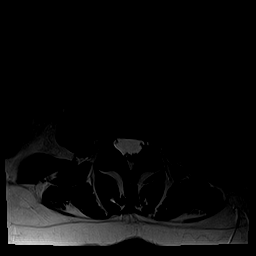
[im 18/35]
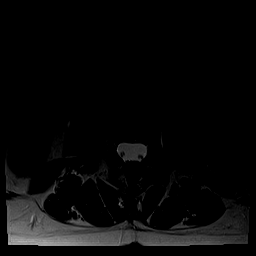
[im 20/35]
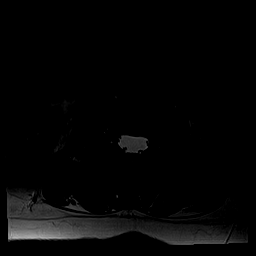
[im 25/35]
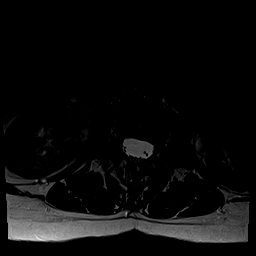
[im 30/35]
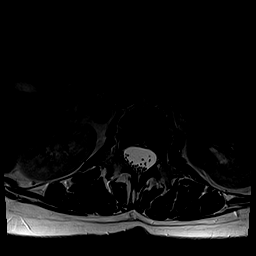
[im 35/35]
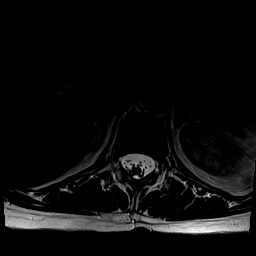

[Series 7: T1 · axial · 4.0mm · 0.35mm/px · z∈[-35,+141]mm · 4 of 35 slices shown (2 of 2)]
[im 1/35]
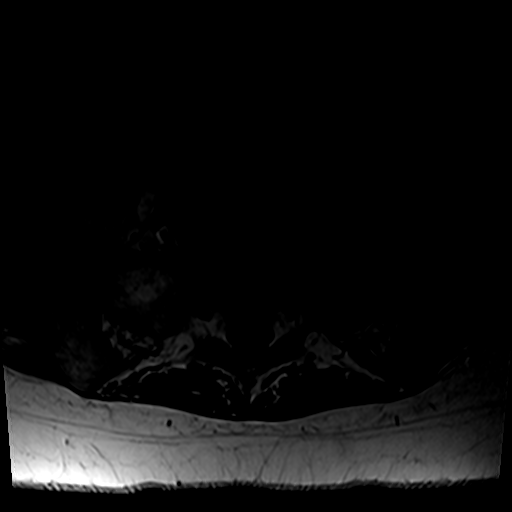
[im 5/35]
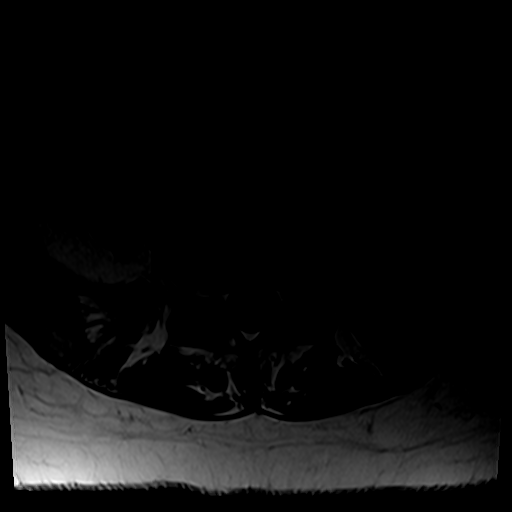
[im 18/35]
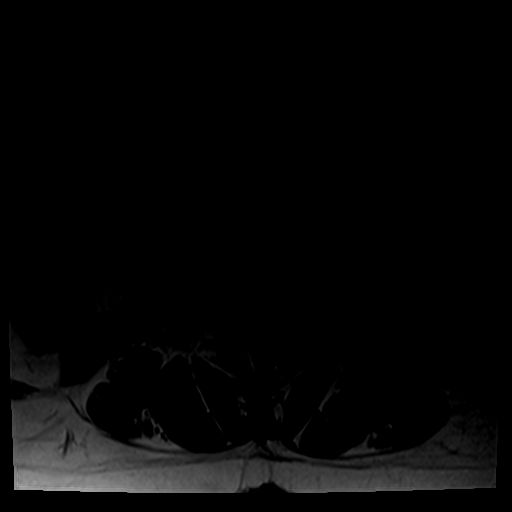
[im 30/35]
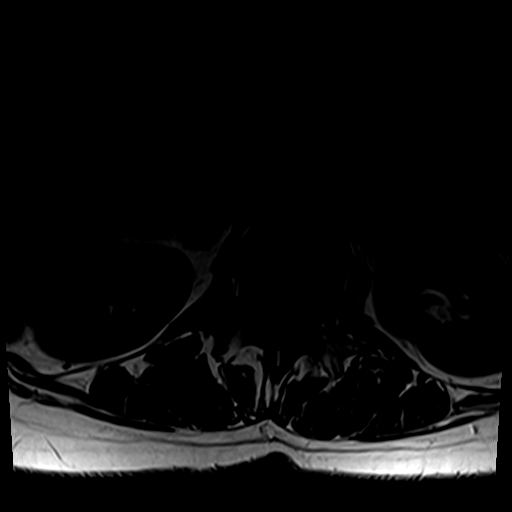

[25 of 48 positions shown; findings below may reference images not displayed]

FINDINGS: Segmentation: Lumbar segmentation appears to be normal and will be
designated as such for this report.

Alignment: Preserved lumbar lordosis. Subtle retrolisthesis at both
L4-L5 and L5-S1. Mild levoconvex upper and dextroconvex lower lumbar
scoliosis.

Vertebrae: No marrow edema or evidence of acute osseous abnormality.
Normal background bone marrow signal. Benign L3 vertebral body
hemangioma. Mild chronic degenerative endplate marrow signal changes
at L5-S1. Intact visible sacrum and SI joints.

Conus medullaris and cauda equina: Conus extends to the T12-L1
level. No lower spinal cord or conus signal abnormality.

Paraspinal and other soft tissues: Negative.

Disc levels:

T11-T12: Negative.

T12-L1: Mild disc bulge and subtle left paracentral disc protrusion
or annular fissure (series 6, image 3) with no stenosis.

L1-L2:  Negative.

L2-L3:  Negative.

L3-L4: Negative disc. Borderline to mild facet hypertrophy without
stenosis.

L4-L5: Subtle posterior annular fissure of the disc. Borderline to
mild posterior element hypertrophy. No stenosis.

L5-S1: Chronic appearing disc space loss with far lateral disc
osteophyte complex. No spinal or lateral recess stenosis. Mild left
greater than right L5 foraminal stenosis.
IMPRESSION: 1. No acute osseous abnormality.  Mild lumbar scoliosis.
2. Chronic appearing disc and endplate degeneration at L5-S1 with up
to mild L5 nerve level foraminal stenosis greater on the left.
3. Mild for age lumbar spine degeneration elsewhere. No other neural
impingement.

## 2019-01-22 IMAGING — MR MR CERVICAL SPINE W/O CM
4 of 5 series · 28 of 48 positions shown · non-contrast
Comparison: None.

CLINICAL DATA: 53-year-old female with 3 months of neck and low
back pain. Symptom onset after [REDACTED]rding. Left hand numbness.
Right leg weakness.

EXAM:
MRI CERVICAL SPINE WITHOUT CONTRAST
TECHNIQUE: Multiplanar, multisequence MR imaging of the cervical spine was
performed. No intravenous contrast was administered.

[Series 3: tir sag · sagittal · 3.0mm · 0.41mm/px · 5 of 13 slices shown]
[im 1/13]
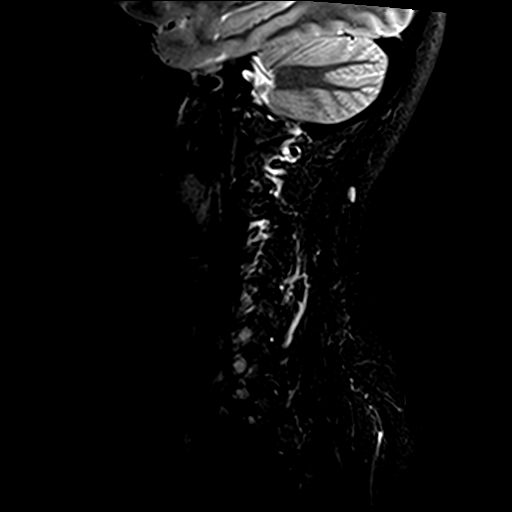
[im 2/13]
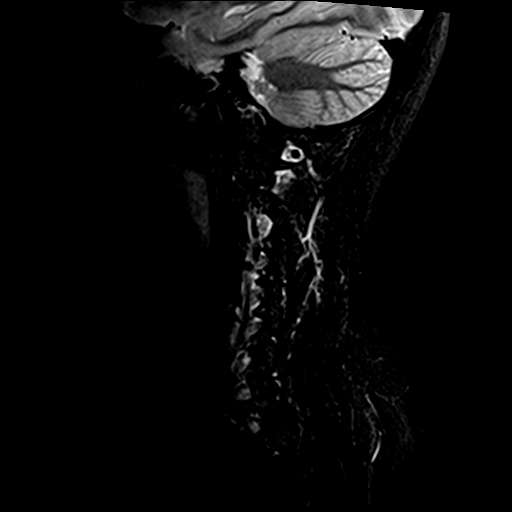
[im 4/13]
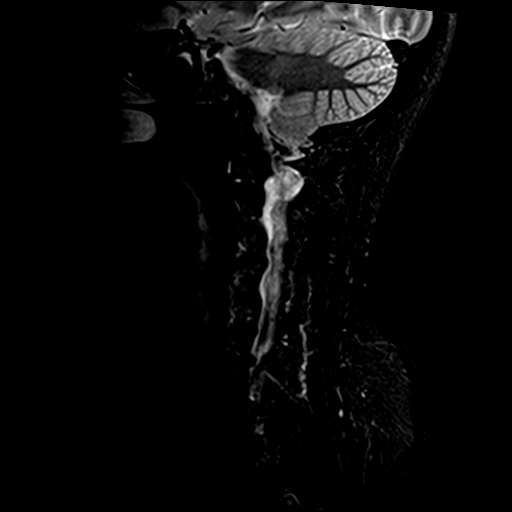
[im 7/13]
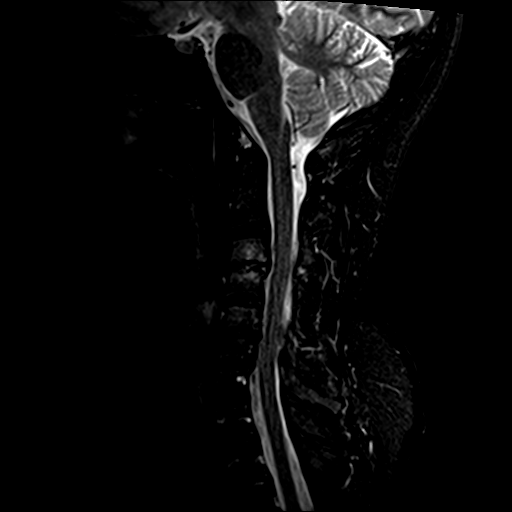
[im 11/13]
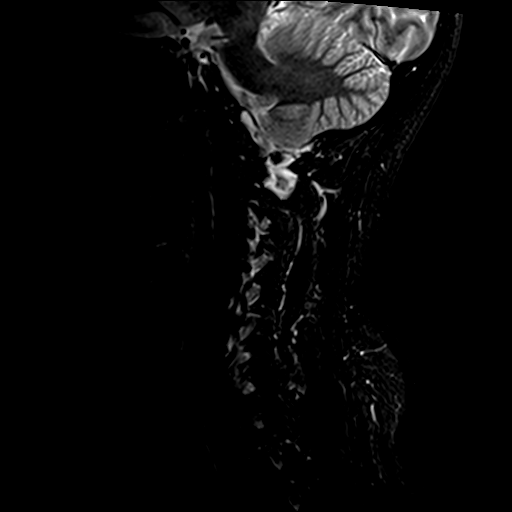

[Series 4: T1 · sagittal · 3.0mm · 0.41mm/px · 7 of 13 slices shown]
[im 1/13]
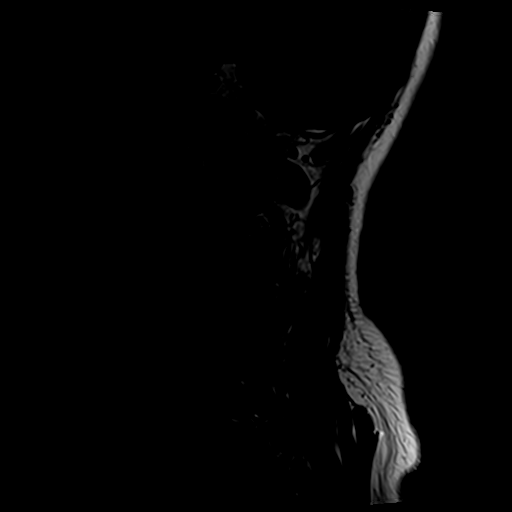
[im 3/13]
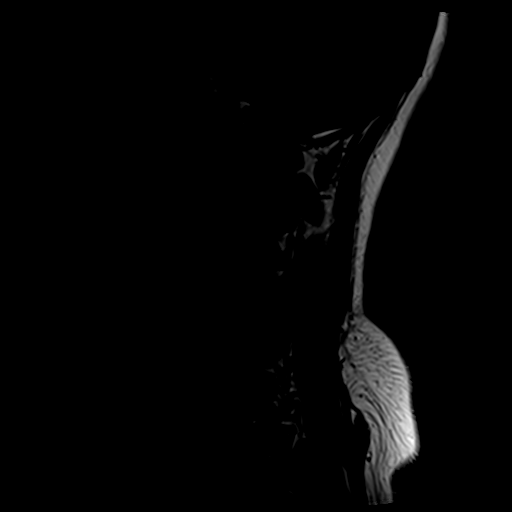
[im 5/13]
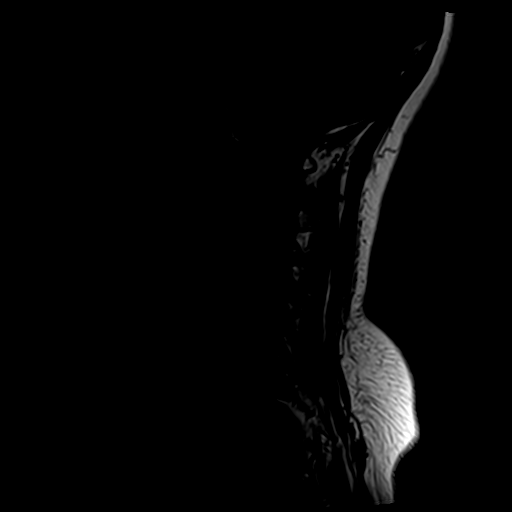
[im 7/13]
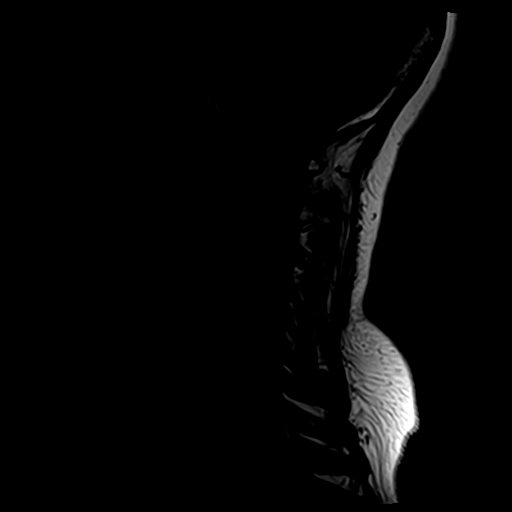
[im 9/13]
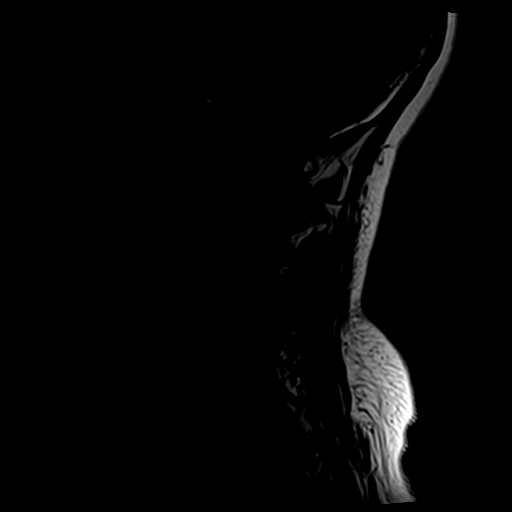
[im 11/13]
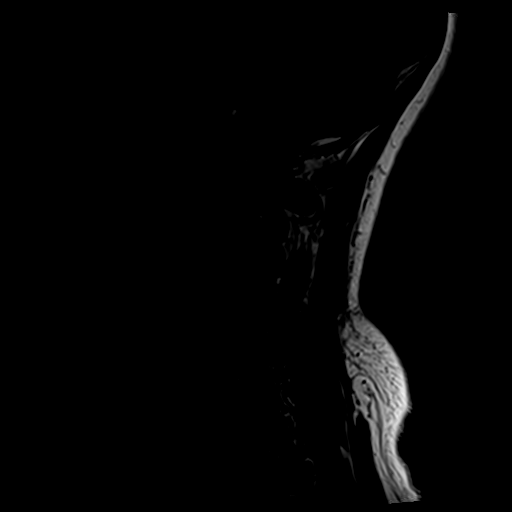
[im 13/13]
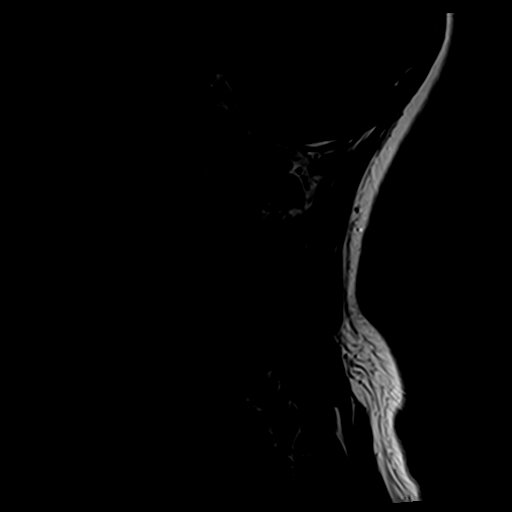

[Series 5: T2 · sagittal · 3.0mm · 0.66mm/px · 7 of 13 slices shown (1 of 2)]
[im 1/13]
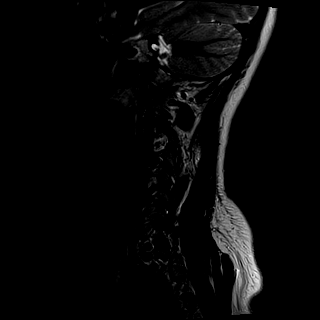
[im 3/13]
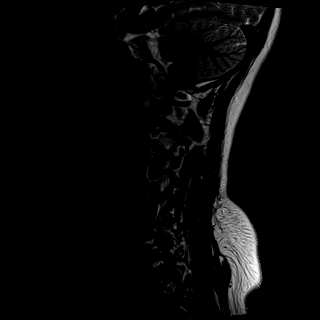
[im 5/13]
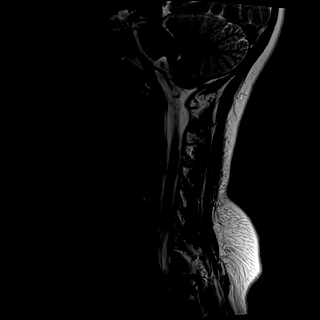
[im 7/13]
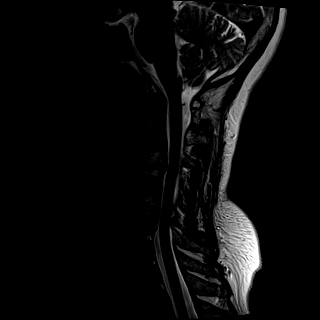
[im 9/13]
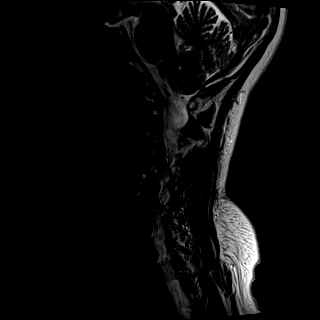
[im 11/13]
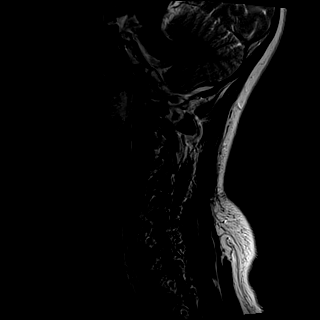
[im 13/13]
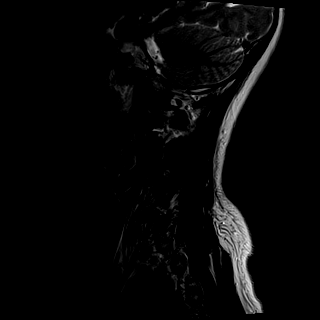

[Series 7: T2 · axial · 3.0mm · 0.70mm/px · z∈[-37,+54]mm · 9 of 25 slices shown (2 of 2)]
[im 1/25]
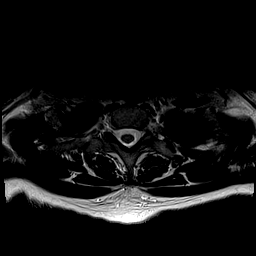
[im 5/25]
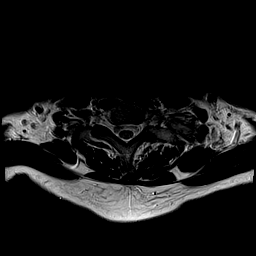
[im 9/25]
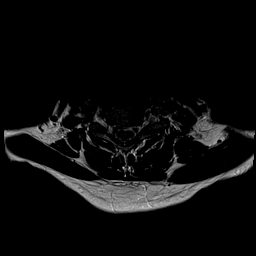
[im 11/25]
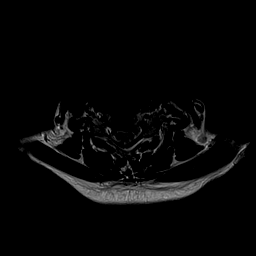
[im 13/25]
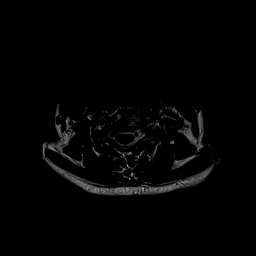
[im 15/25]
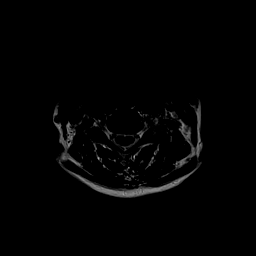
[im 17/25]
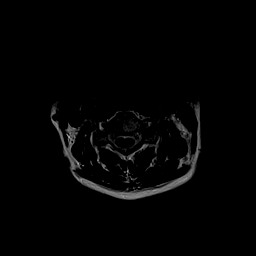
[im 21/25]
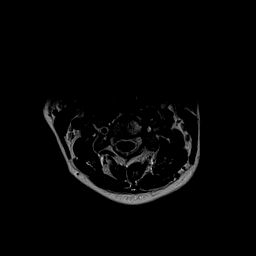
[im 25/25]
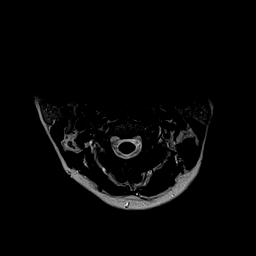

[28 of 48 positions shown; findings below may reference images not displayed]

FINDINGS: Alignment: Straightening and mild reversal of cervical lordosis. No
spondylolisthesis.

Vertebrae: Confluent degenerative appearing endplate and vertebral
body marrow edema eccentric to the left at C3-C4 (series 3, images 8
and 9). Chronic degenerative endplate marrow signal changes at C5-C6
also eccentric to the left. Background bone marrow signal is within
normal limits. No other acute osseous abnormality identified.

Cord: Spinal cord signal is within normal limits at all visualized
levels.

Posterior Fossa, vertebral arteries, paraspinal tissues:
Cervicomedullary junction is within normal limits. Negative visible
brain parenchyma.

Preserved major vascular flow voids in the neck. Tortuous left
vertebral artery. Negative visible neck soft tissues and lung
apices.

Disc levels:

C2-C3:  Negative.

C3-C4: Disc space loss with left eccentric circumferential disc
osteophyte complex. Mild posterior element hypertrophy. Borderline
to mild spinal stenosis. Moderate left C4 foraminal stenosis. Mild
right foraminal stenosis.

C4-C5:  Negative disc. Mild facet hypertrophy. No stenosis.

C5-C6: Disc space loss with circumferential disc osteophyte complex
eccentric to the left. Mild posterior element hypertrophy. Mild
spinal stenosis. Mild if any left hemi cord mass effect (series 6,
image 15). Moderate to severe left and mild right C6 foraminal
stenosis.

C6-C7: Disc space loss with circumferential disc osteophyte complex.
Mild ligament flavum hypertrophy. Borderline to mild spinal stenosis
without cord mass effect. Mild to moderate right C7 foraminal
stenosis.

C7-T1:  Mild facet hypertrophy, otherwise negative.

Negative visible upper thoracic levels.
IMPRESSION: Chronic disc and endplate degeneration at C3-C4, C5-C6 and C6-C7.
Up to mild spinal stenosis at each level, but minimal if any spinal
cord mass effect and no cord signal abnormality.
Confluent degenerative marrow edema at C3-C4 is eccentric to the
left, and might be the symptomatic abnormality.
But there is also associated moderate or severe left C4 and C6 nerve
level foraminal stenosis.

## 2019-01-24 ENCOUNTER — Other Ambulatory Visit: Payer: Self-pay | Admitting: Orthopedic Surgery

## 2019-01-24 DIAGNOSIS — M542 Cervicalgia: Secondary | ICD-10-CM

## 2019-01-24 DIAGNOSIS — M545 Low back pain, unspecified: Secondary | ICD-10-CM

## 2019-03-03 ENCOUNTER — Other Ambulatory Visit: Payer: Self-pay | Admitting: Obstetrics and Gynecology

## 2019-03-03 ENCOUNTER — Other Ambulatory Visit: Payer: Self-pay | Admitting: Physician Assistant

## 2019-03-03 DIAGNOSIS — N950 Postmenopausal bleeding: Secondary | ICD-10-CM

## 2019-03-07 ENCOUNTER — Ambulatory Visit: Payer: Commercial Managed Care - PPO | Admitting: Obstetrics and Gynecology

## 2019-03-08 ENCOUNTER — Ambulatory Visit
Admission: RE | Admit: 2019-03-08 | Discharge: 2019-03-08 | Disposition: A | Discharge status: Home / Self Care | Payer: Commercial Managed Care - PPO | Source: Ambulatory Visit | Attending: Obstetrics and Gynecology | Admitting: Obstetrics and Gynecology

## 2019-03-08 ENCOUNTER — Other Ambulatory Visit: Payer: Commercial Managed Care - PPO

## 2019-03-08 DIAGNOSIS — N950 Postmenopausal bleeding: Secondary | ICD-10-CM

## 2019-03-08 IMAGING — US US PELVIS COMPLETE WITH TRANSVAGINAL
1 series · 13 of 25 positions shown · non-contrast
Comparison: None available.

CLINICAL DATA: Initial evaluation for postmenopausal bleeding.



[Series 1: us pelvis complete with transvaginal · 0.16mm/px · 13 of 62 slices shown]
[im 1/62]
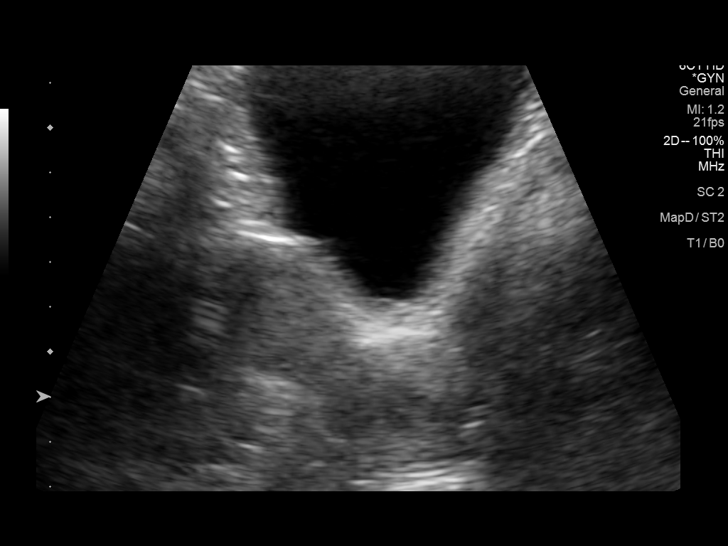
[im 6/62]
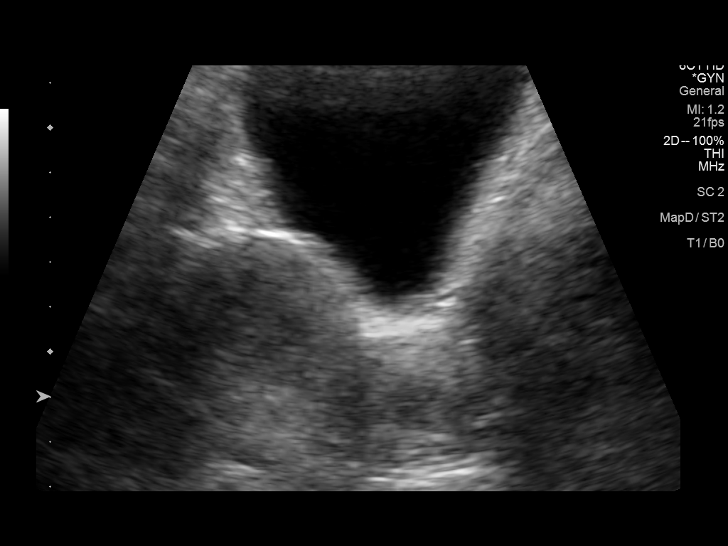
[im 11/62]
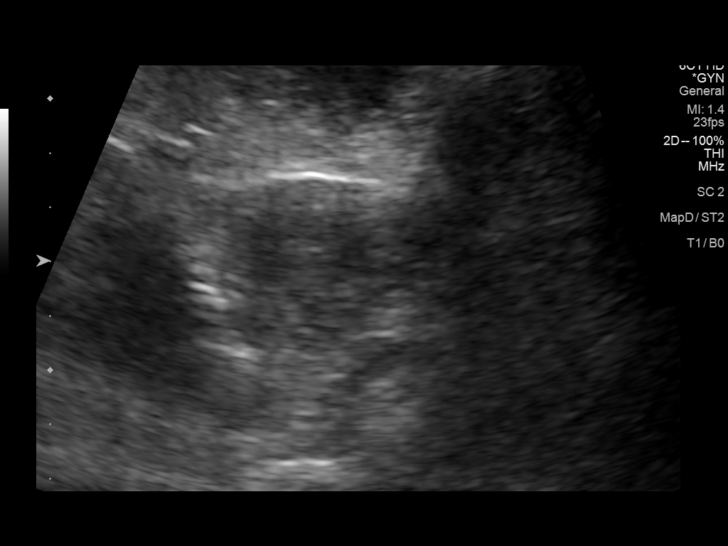
[im 16/62]
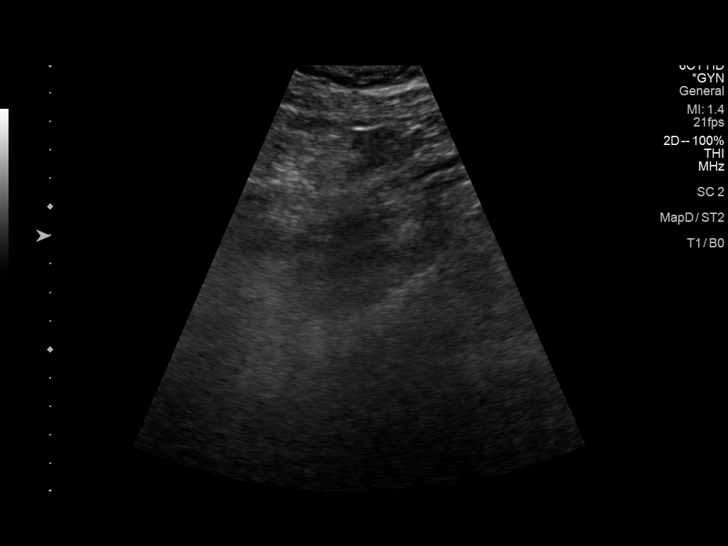
[im 21/62]
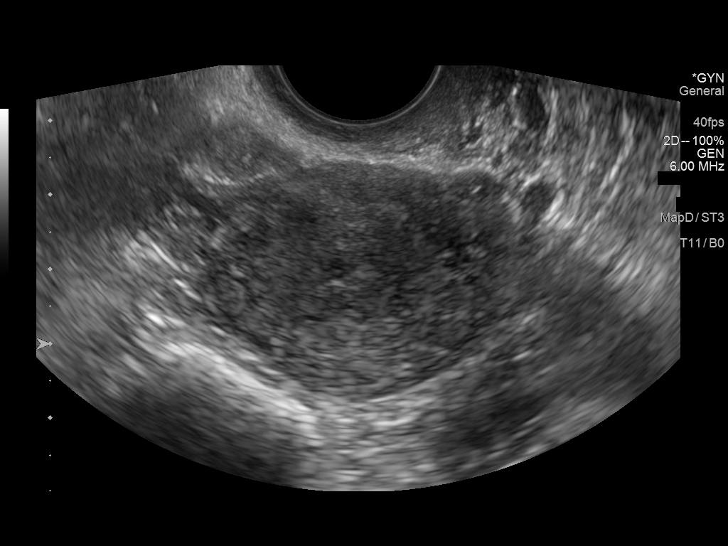
[im 26/62]
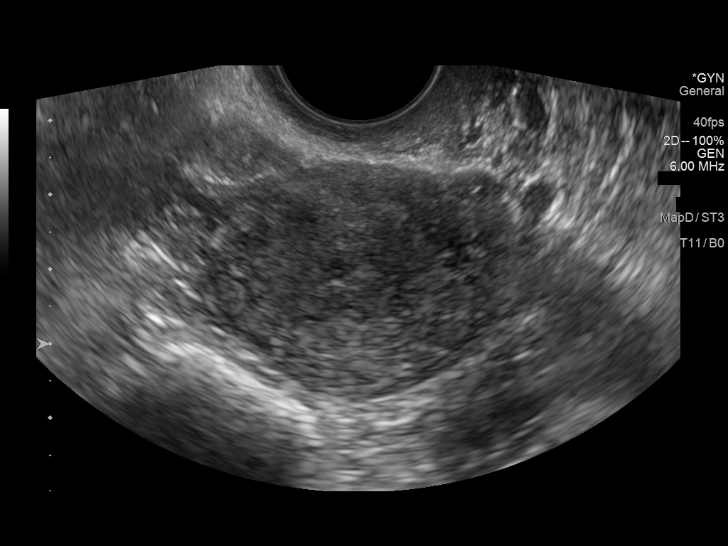
[im 31/62]
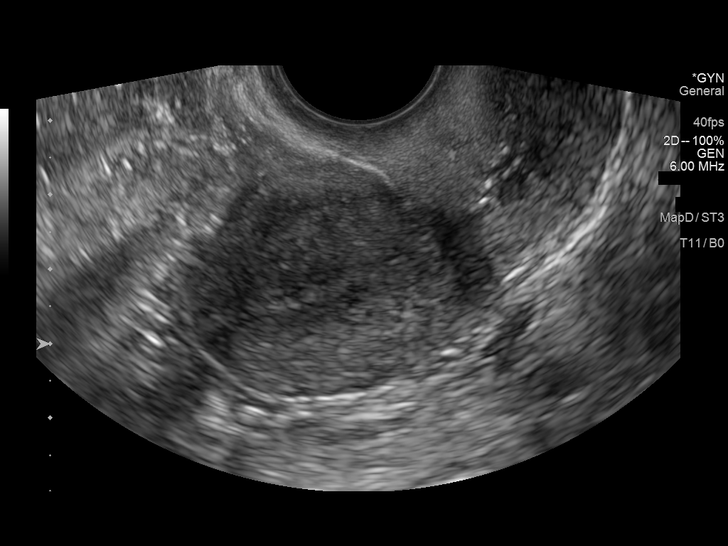
[im 36/62]
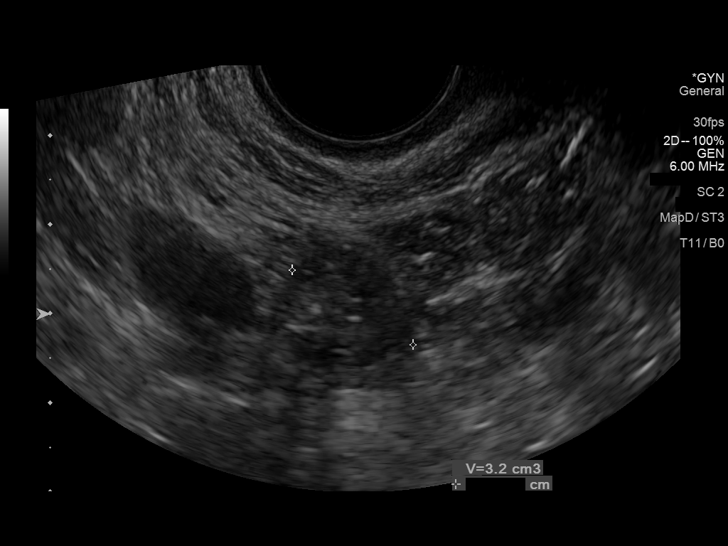
[im 41/62]
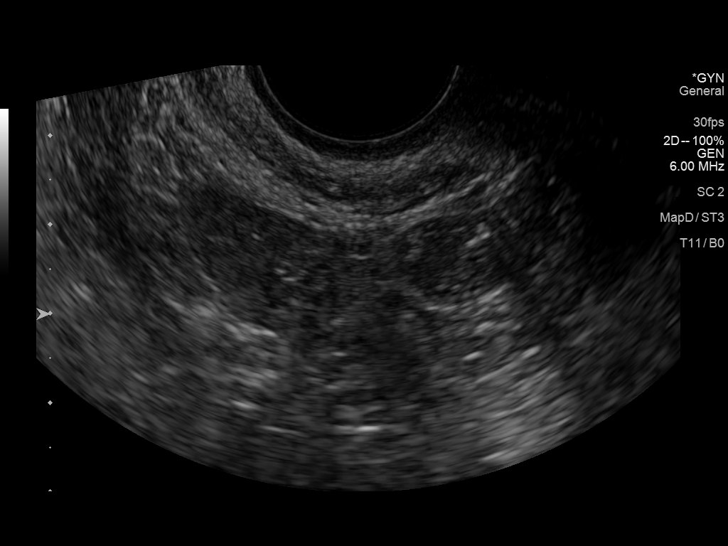
[im 46/62]
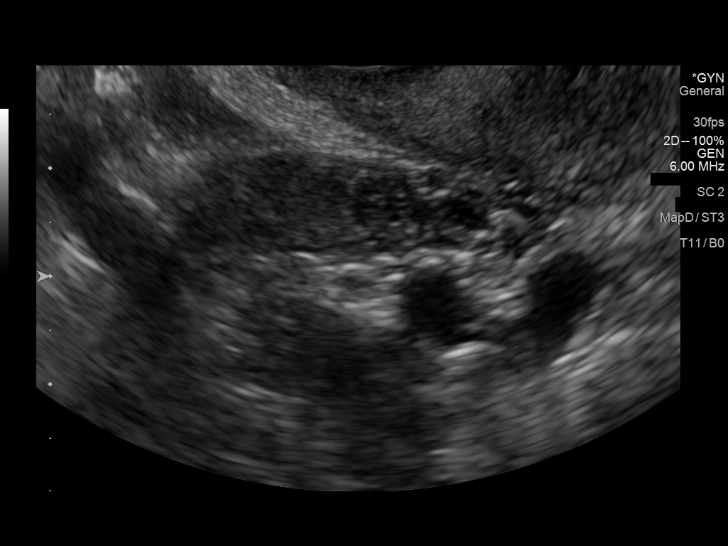
[im 51/62]
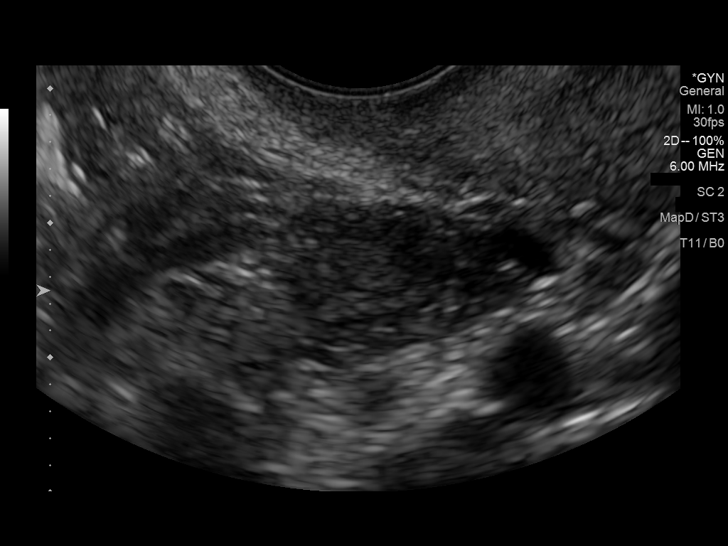
[im 56/62]
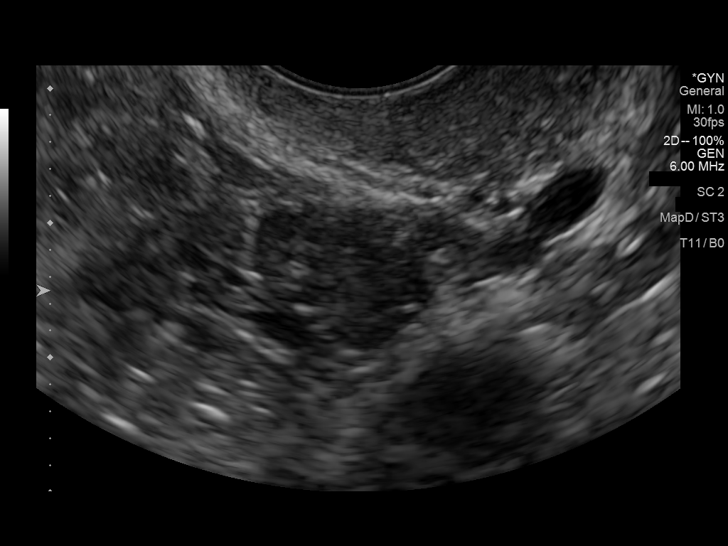
[im 62/62]
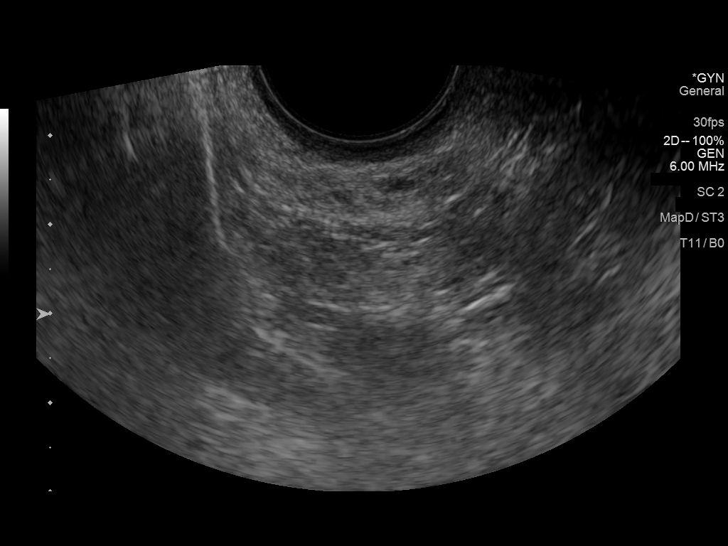

[13 of 25 positions shown; findings below may reference images not displayed]

FINDINGS: Uterus

Measurements: 6.9 x 3.1 x 3.9 cm = volume: 43.5 mL. No fibroids or
other mass visualized.

Endometrium

Thickness: 2.4 mm.  No focal abnormality visualized.

Right ovary

Measurements: 3.0 x 1.3 x 1.6 cm = volume: 3.2 mL. Normal
appearance/no adnexal mass.

Left ovary

Measurements: 2.6 x 1.0 x 1.1 cm = volume: 1.5 mL. Normal
appearance/no adnexal mass.

Other findings

No abnormal free fluid.
IMPRESSION: 1. Endometrial stripe measures 2.4 mm in thickness. In the setting
of post-menopausal bleeding, this is consistent with a benign
etiology such as endometrial atrophy. If bleeding remains
unresponsive to hormonal or medical therapy, sonohysterogram should
be considered for focal lesion work-up. (Ref: Radiological
Reasoning: Algorithmic Workup of Abnormal Vaginal Bleeding with
Endovaginal Sonography and Sonohysterography. AJR [26]; 191:S68-73).
2. Otherwise unremarkable and normal pelvic ultrasound.

## 2019-03-16 NOTE — Progress Notes (Signed)
NEUROLOGY CONSULTATION NOTE  Jeanette Ross MRN: [id number] DOB: 01-01-1966  Referring provider: Courtney Paris, NP Primary care provider: Hetty Blend, NP-C  Reason for consult:  dizziness  HISTORY OF PRESENT ILLNESS: Jeanette Ross is a 54 year old female with hyperlipidemia and pre-diabetes who presents for dizziness.  History supplemented by referring provider note.  MRI of cervical and lumbar spine personally reviewed.   In September, she started experiencing dizziness and headache.  The dizziness wasn't acute spinning sensation but rather a feeling of heaviness and slight movement in the head and she would feel off balance.  It may occur with neck or head movements.  She also started having headaches that were occipital as well as below her eyes.  CT of head on 12/16/2018 was negative.  She also reported neck and back pain.  MRI of lumbar spine from 01/23/2019 showed chronic L5-S1 disc degeneration with mild bilateral L5 foraminal stenosis.  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  She was subsequently diagnosed with prediabetes and menopause.  She is now on hormone replacement therapy and taking Ozempic.  She contracted Covid in December.  After she recovered, she underwent facet joint injections in her cervical spine in early January.  Over the past month, dizziness and headache have resolved.  She sometimes notes some mild blurred vision when she is stressed.  Eye exam reportedly normal.  12/22/2018 LABS:  ANA negative; ferritin 93.9, ceruloplasmin 22.1, Hep A & B negative; iron 165, UIBC 260, TIBC 425  PAST MEDICAL HISTORY: Past Medical History:  Diagnosis Date  . Elevated ALT measurement 2020    PAST SURGICAL HISTORY: Past Surgical History:  Procedure Laterality Date  . AUGMENTATION MAMMAPLASTY     Silicone implants  . KNEE ARTHROSCOPY Left      MEDICATIONS: Current Outpatient Medications on File Prior to Visit  Medication Sig Dispense Refill  . ALPRAZolam (XANAX) 0.25 MG tablet TAKE 1 TAB 1 HR PRIOR TO PROCEDURE. MAY TAKE 2ND TAB AT TIME OF PROCEDURE IF 1ST TAB IS INEFFECTIVE    . Biotin 1 MG CAPS Take 1 capsule by mouth daily.    . diazepam (VALIUM) 5 MG tablet SMARTSIG:1 Tablet(s) By Mouth As Needed    . estradiol (VIVELLE-DOT) 0.0375 MG/24HR     . meclizine (ANTIVERT) 25 MG tablet Take 25 mg by mouth 3 (three) times daily as needed.    . meloxicam (MOBIC) 15 MG tablet Take 15 mg by mouth daily.    . methocarbamol (ROBAXIN) 500 MG tablet Take 500 mg by mouth every 6 (six) hours as needed.    . Multiple Vitamin (MULTIVITAMIN) capsule Take 1 capsule by mouth daily.    Marland Kitchen OZEMPIC, 0.25 OR 0.5 MG/DOSE, 2 MG/1.5ML SOPN Inject 0.25 mg as directed once a week.    Marland Kitchen PARoxetine (PAXIL) 10 MG tablet Take 10 mg by mouth daily.    . progesterone (PROMETRIUM) 100 MG capsule     . Testosterone 10 MG/ACT (2%) GEL SMARTSIG:2 Pump Topical Daily    . YUVAFEM 10 MCG TABS vaginal tablet      No current facility-administered medications on file prior to visit.    ALLERGIES: No Known Allergies  FAMILY HISTORY: Family History  Problem Relation Age of Onset  . Diabetes Sister     SOCIAL HISTORY: Social History   Socioeconomic History  . Marital status: Married    Spouse name: Not on  file  . Number of children: Not on file  . Years of education: Not on file  . Highest education level: Not on file  Occupational History  . Not on file  Tobacco Use  . Smoking status: Never Smoker  . Smokeless tobacco: Never Used  Substance and Sexual Activity  . Alcohol use: Yes    Alcohol/week: 3.0 standard drinks    Types: 3 Glasses of wine per week  . Drug use: Never  . Sexual activity: Yes    Birth control/protection: None  Other Topics Concern  . Not on file  Social History Narrative  . Not on file   Social Determinants of Health    Financial Resource Strain:   . Difficulty of Paying Living Expenses: Not on file  Food Insecurity:   . Worried About Programme researcher, broadcasting/film/video in the Last Year: Not on file  . Ran Out of Food in the Last Year: Not on file  Transportation Needs:   . Lack of Transportation (Medical): Not on file  . Lack of Transportation (Non-Medical): Not on file  Physical Activity:   . Days of Exercise per Week: Not on file  . Minutes of Exercise per Session: Not on file  Stress:   . Feeling of Stress : Not on file  Social Connections:   . Frequency of Communication with Friends and Family: Not on file  . Frequency of Social Gatherings with Friends and Family: Not on file  . Attends Religious Services: Not on file  . Active Member of Clubs or Organizations: Not on file  . Attends Banker Meetings: Not on file  . Marital Status: Not on file  Intimate Partner Violence:   . Fear of Current or Ex-Partner: Not on file  . Emotionally Abused: Not on file  . Physically Abused: Not on file  . Sexually Abused: Not on file    PHYSICAL EXAM: Blood pressure 122/79, pulse 86, resp. rate 18, height 5\' 3"  (1.6 m), weight 134 lb (60.8 kg), SpO2 100 %. General: No acute distress.  Patient appears well-groomed.  Head:  Normocephalic/atraumatic Eyes:  fundi examined but not visualized Neck: supple, mild bilateral paraspinal tenderness, full range of motion Back: No paraspinal tenderness Heart: regular rate and rhythm Lungs: Clear to auscultation bilaterally. Vascular: No carotid bruits. Neurological Exam: Mental status: alert and oriented to person, place, and time, recent and remote memory intact, fund of knowledge intact, attention and concentration intact, speech fluent and not dysarthric, language intact. Cranial nerves: CN I: not tested CN II: pupils equal, round and reactive to light, visual fields intact CN III, IV, VI:  full range of motion, no nystagmus, no ptosis CN V: facial sensation  intact CN VII: upper and lower face symmetric CN VIII: hearing intact CN IX, X: gag intact, uvula midline CN XI: sternocleidomastoid and trapezius muscles intact CN XII: tongue midline Bulk & Tone: normal, no fasciculations. Motor:  5/5 throughout  Sensation: temperature and vibration sensation intact. Deep Tendon Reflexes:  2+ throughout, toes downgoing.  Finger to nose testing:  Without dysmetria.  Heel to shin:  Without dysmetria.  Gait:  Normal station and stride.  Able to turn and tandem walk. Romberg negative.  IMPRESSION: Probable cervicogenic dizziness and headache.  She has degenerative changes in her cervical spine.  Symptoms improved after cervical facet joint injections.  PLAN: If symptoms return in conjunction with neck pain, she may benefit from another round of injections.  Otherwise, she may follow up as needed.  Thank you for allowing me to take part in the care of this patient.  Metta Clines, DO  CC:  Simona Huh, NP  Harland Dingwall, NP-C

## 2019-03-18 ENCOUNTER — Encounter: Payer: Self-pay | Admitting: Neurology

## 2019-03-18 ENCOUNTER — Ambulatory Visit (INDEPENDENT_AMBULATORY_CARE_PROVIDER_SITE_OTHER): Payer: Commercial Managed Care - PPO | Admitting: Neurology

## 2019-03-18 ENCOUNTER — Other Ambulatory Visit: Payer: Self-pay

## 2019-03-18 VITALS — BP 122/79 | HR 86 | Resp 18 | Ht 63.0 in | Wt 134.0 lb

## 2019-03-18 DIAGNOSIS — R42 Dizziness and giddiness: Secondary | ICD-10-CM

## 2019-03-18 DIAGNOSIS — R519 Headache, unspecified: Secondary | ICD-10-CM

## 2019-03-18 DIAGNOSIS — G4486 Cervicogenic headache: Secondary | ICD-10-CM

## 2019-03-18 NOTE — Patient Instructions (Signed)
I think both the dizziness and headaches were related to the arthritis and pain in your neck.  If you have a recurrence of headache and dizziness and note worsening neck pain, you may need repeat injections.  Otherwise, make a follow up appointment with me

## 2019-08-10 ENCOUNTER — Other Ambulatory Visit (HOSPITAL_COMMUNITY): Payer: Self-pay | Admitting: Orthopedic Surgery

## 2019-08-10 DIAGNOSIS — M79632 Pain in left forearm: Secondary | ICD-10-CM

## 2019-08-10 DIAGNOSIS — M79631 Pain in right forearm: Secondary | ICD-10-CM

## 2019-08-23 ENCOUNTER — Encounter (HOSPITAL_COMMUNITY): Payer: Self-pay

## 2019-08-23 ENCOUNTER — Ambulatory Visit (HOSPITAL_COMMUNITY): Payer: Commercial Managed Care - PPO

## 2019-08-23 ENCOUNTER — Other Ambulatory Visit: Payer: Self-pay

## 2019-08-23 ENCOUNTER — Ambulatory Visit (HOSPITAL_COMMUNITY)
Admission: RE | Admit: 2019-08-23 | Discharge: 2019-08-23 | Disposition: A | Discharge status: Home / Self Care | Payer: Commercial Managed Care - PPO | Source: Ambulatory Visit | Attending: Orthopedic Surgery | Admitting: Orthopedic Surgery

## 2019-08-23 DIAGNOSIS — M79631 Pain in right forearm: Secondary | ICD-10-CM | POA: Insufficient documentation

## 2019-08-23 DIAGNOSIS — M79632 Pain in left forearm: Secondary | ICD-10-CM | POA: Insufficient documentation

## 2019-08-23 IMAGING — CT CT FOREARM*R* W/O CM
3 of 5 series · 11 of 36 positions shown, 12 images · non-contrast
Comparison: None.

CLINICAL DATA: Bilateral forearm pain.  No known injury.

EXAM:
CT OF THE RIGHT FOREARM WITHOUT CONTRAST
TECHNIQUE: Multidetector CT imaging was performed according to the standard
protocol. Multiplanar CT image reconstructions were also generated.

[Series 10: 2 thin st · axial · right · 0.38mm/px · z∈[+866,+999]mm · 2 of 587 slices shown, 3 images]
[im 160/587  soft-tissue]
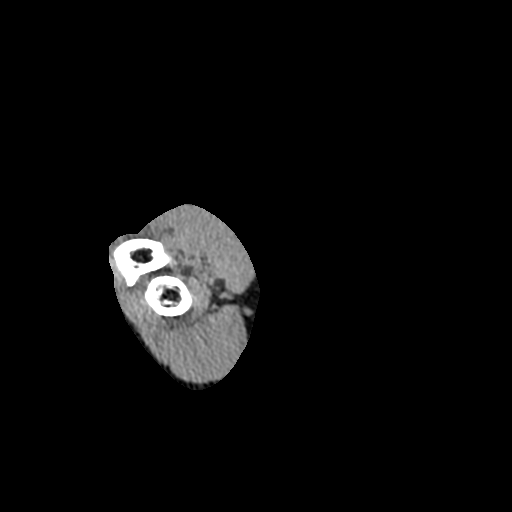
[im 160/587  bone]
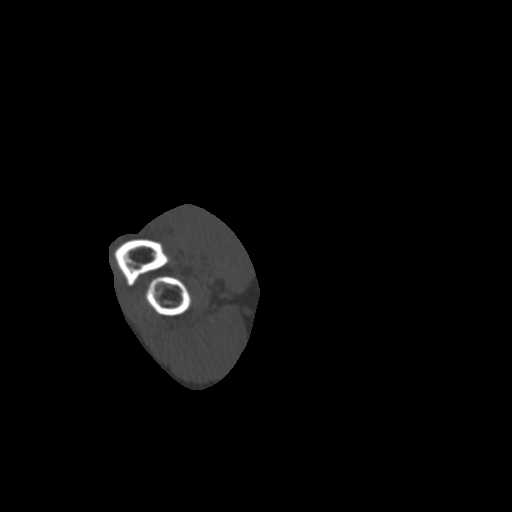
[im 427/587  bone]
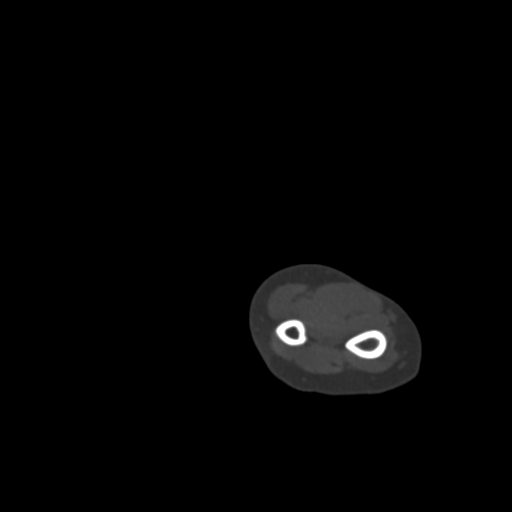

[Series 12: sag bone · sagittal · right · 0.30mm/px · 6 of 67 slices shown]
[im 18/67  bone]
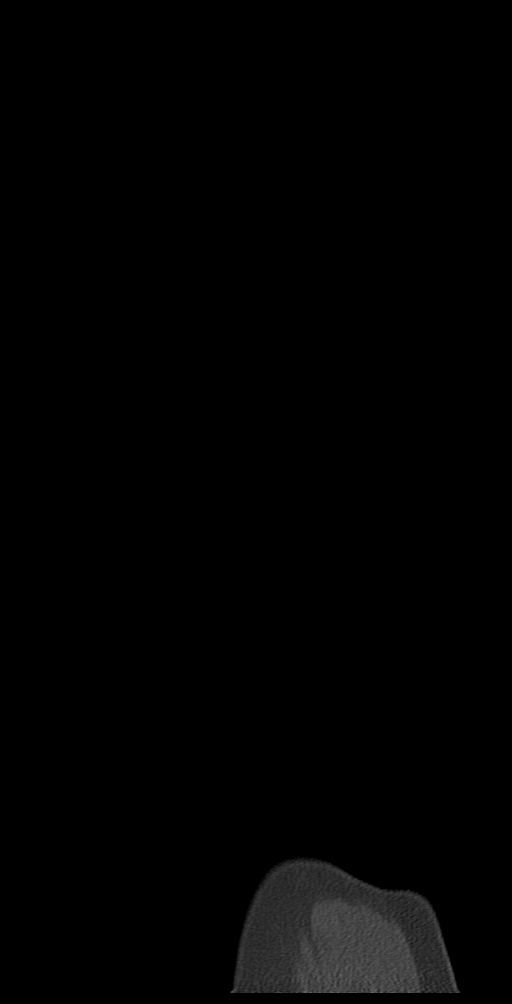
[im 26/67  bone]
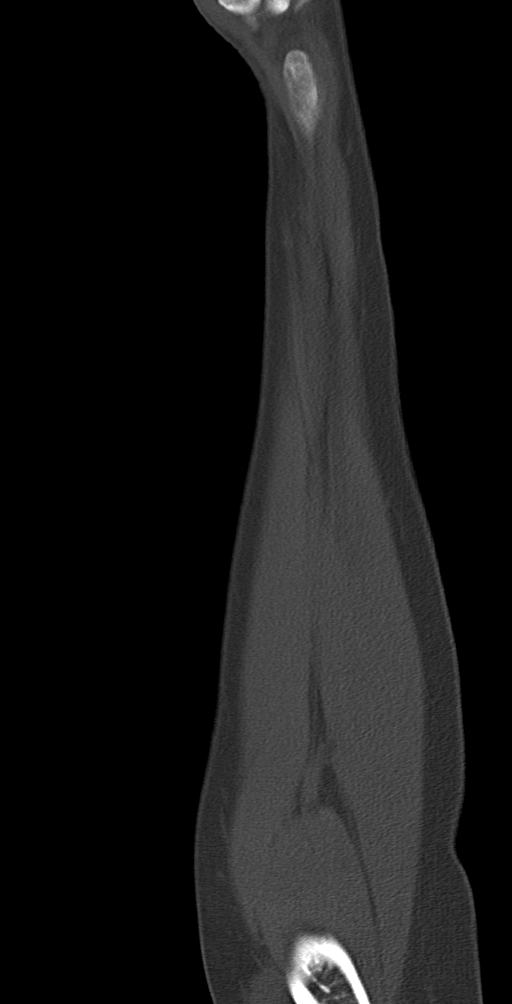
[im 31/67  soft-tissue]
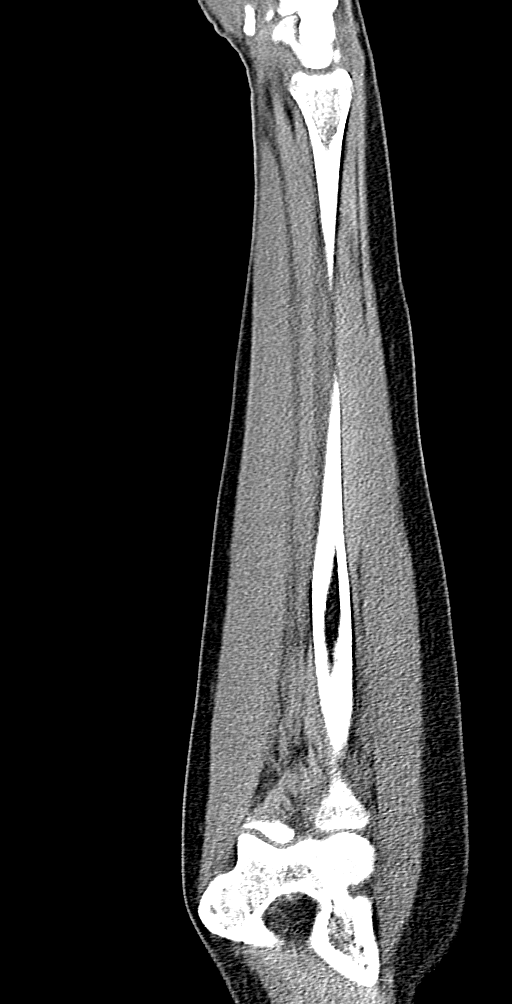
[im 34/67  bone]
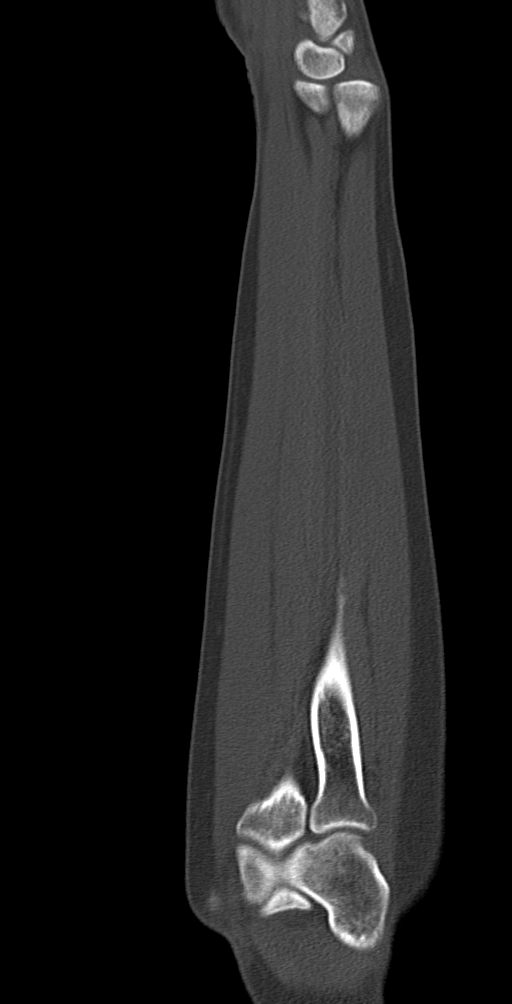
[im 41/67  bone]
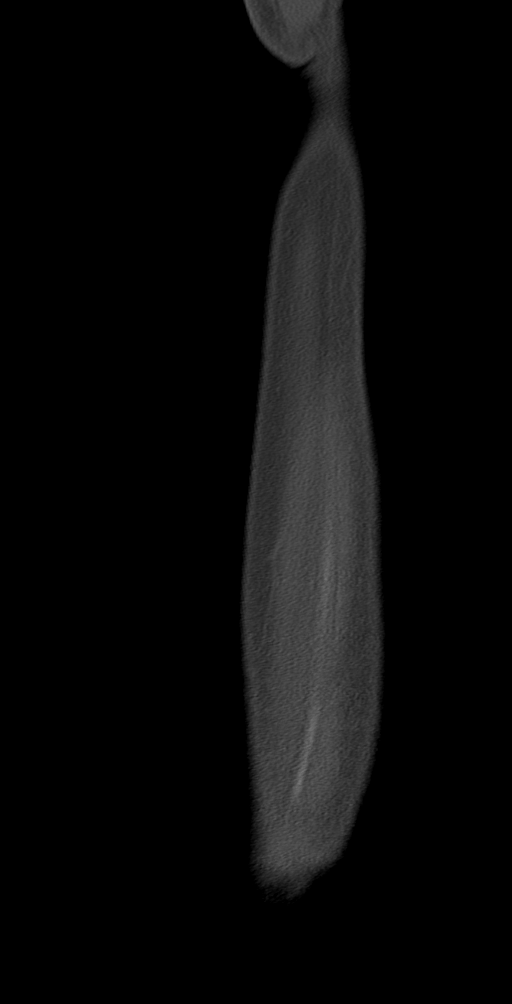
[im 49/67  bone]
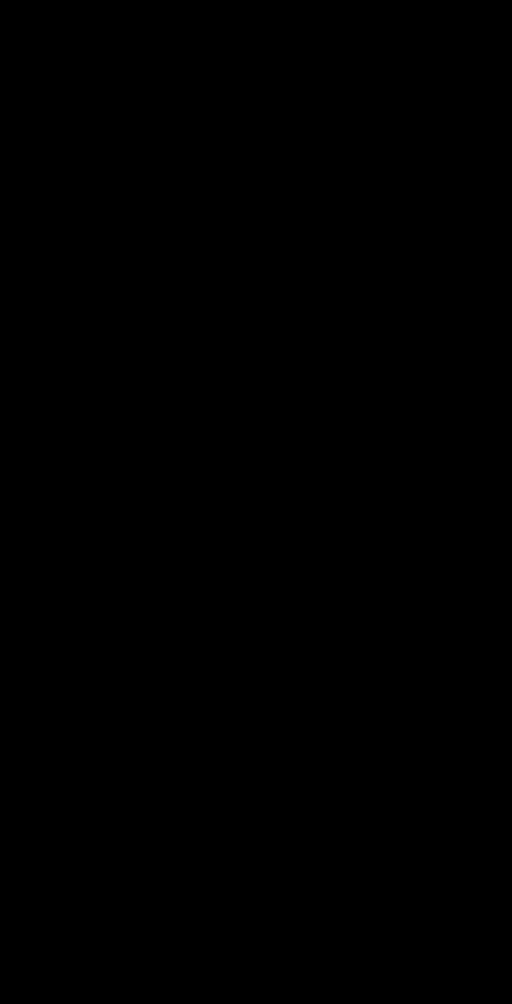

[Series 13: cor st · coronal · right · 0.29mm/px · 3 of 40 slices shown]
[im 8/40  bone]
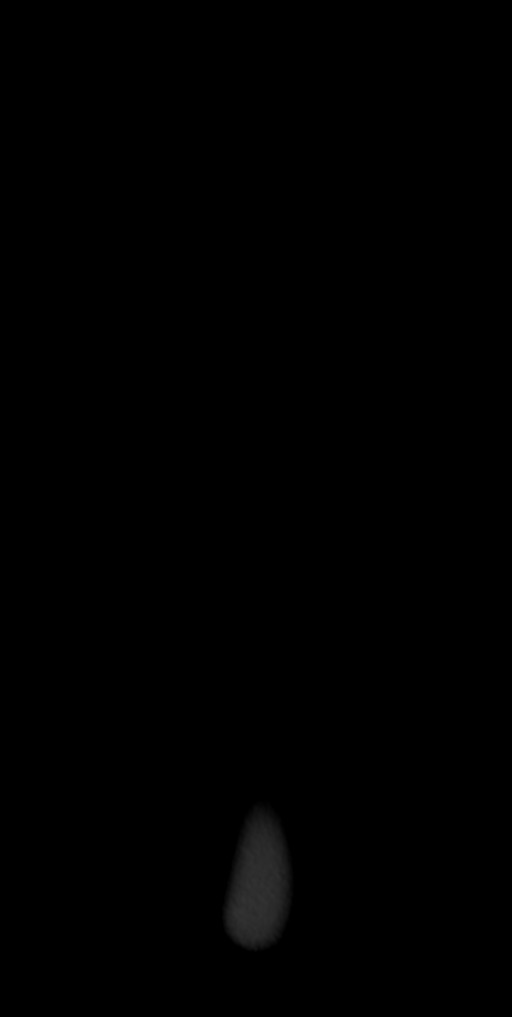
[im 16/40  bone]
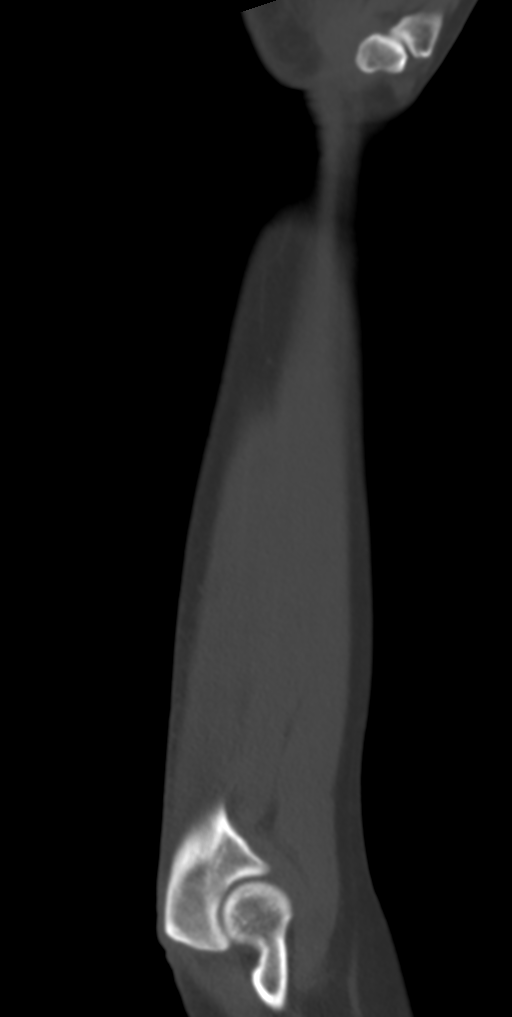
[im 24/40  bone]
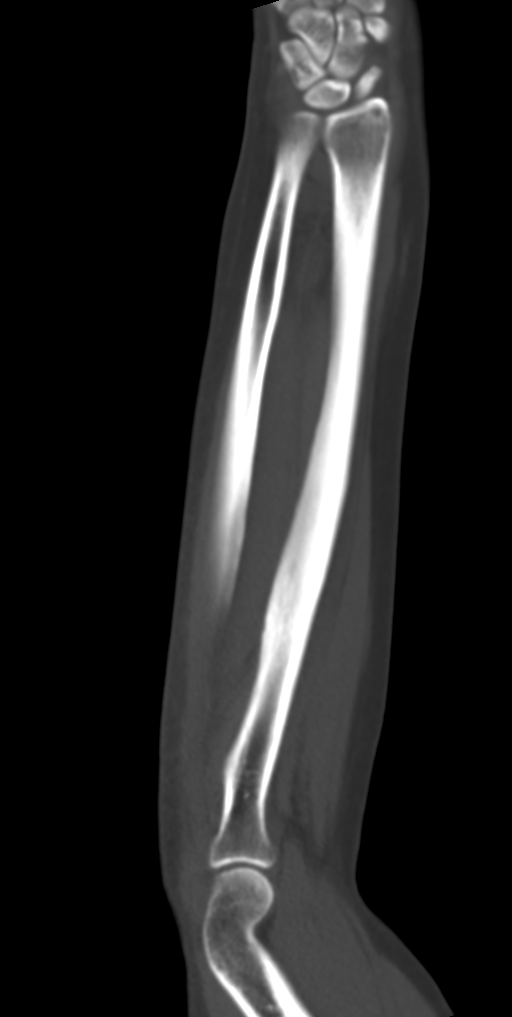

[11 of 36 positions shown; findings below may reference images not displayed]

FINDINGS: Bones/Joint/Cartilage

No evidence of acute fracture or dislocation. The joint spaces
appear preserved at the right wrist and elbow. No significant elbow
joint effusion. Tiny intraosseous cyst proximally in the capitate.

Ligaments

Suboptimally assessed by CT.

Muscles and Tendons

Unremarkable.

Soft tissues

Unremarkable. No inflammatory changes, fluid collections or foreign
bodies are seen.
IMPRESSION: Normal CT of the right forearm.

## 2019-08-23 IMAGING — CT CT FOREARM*L* W/O CM
2 of 5 series · 11 of 36 positions shown, 13 images · non-contrast
Comparison: None.

CLINICAL DATA: Bilateral forearm pain.  No known injury.

EXAM:
CT OF THE LEFT FOREARM WITHOUT CONTRAST
TECHNIQUE: Multidetector CT imaging was performed according to the standard
protocol. Multiplanar CT image reconstructions were also generated.

[Series 18: 2 thin st · axial · left · 0.33mm/px · z∈[+832,+1066]mm · 8 of 573 slices shown, 10 images]
[im 53/573  soft-tissue]
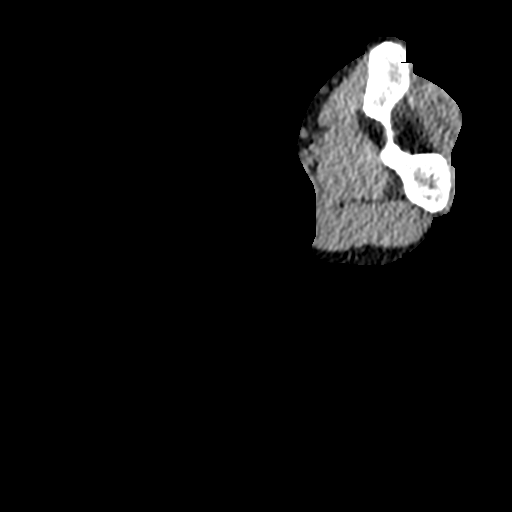
[im 53/573  bone]
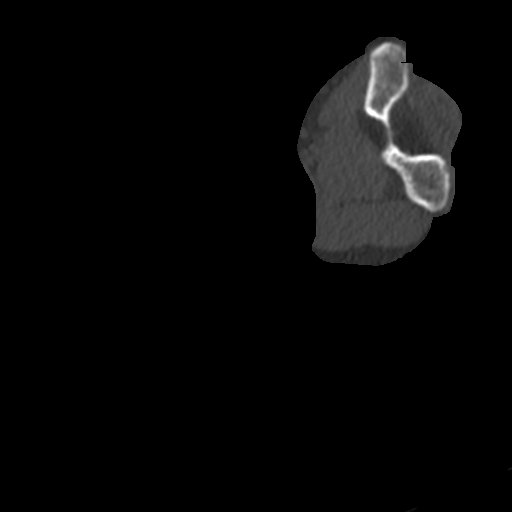
[im 105/573  bone]
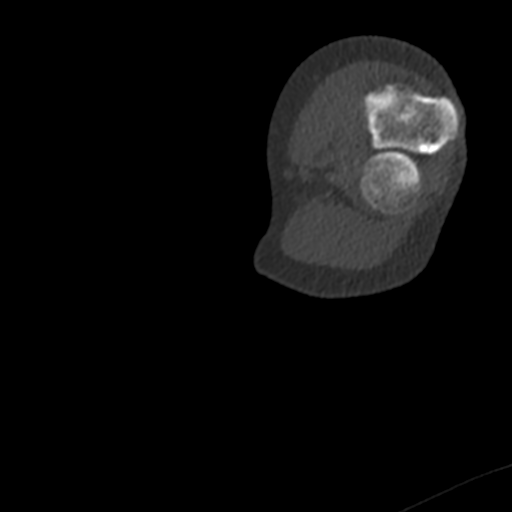
[im 209/573  bone]
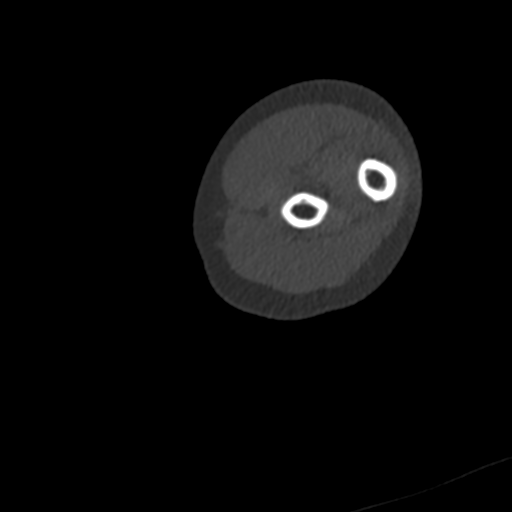
[im 261/573  bone]
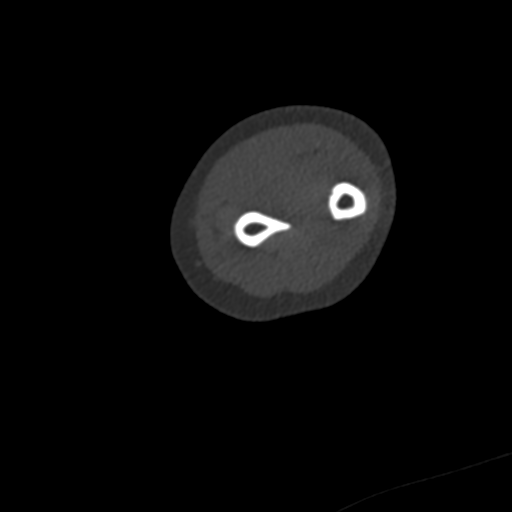
[im 313/573  soft-tissue]
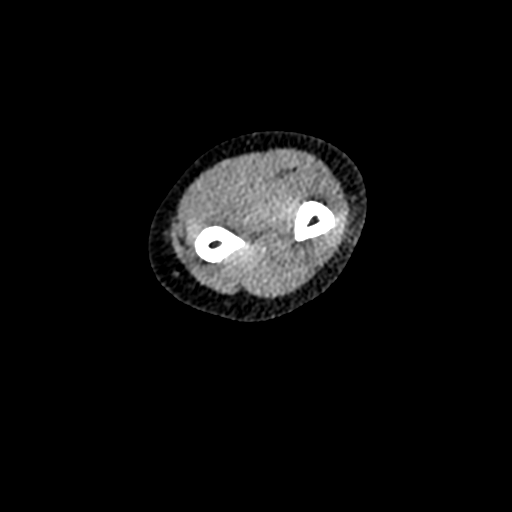
[im 313/573  bone]
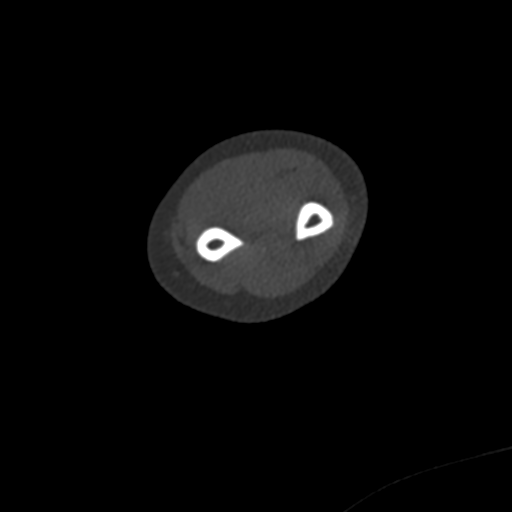
[im 365/573  bone]
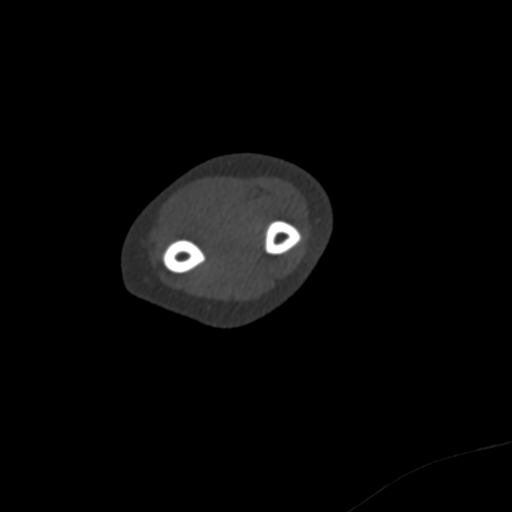
[im 469/573  bone]
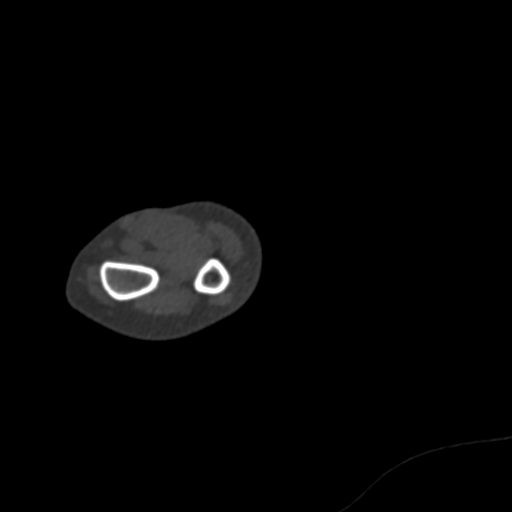
[im 521/573  bone]
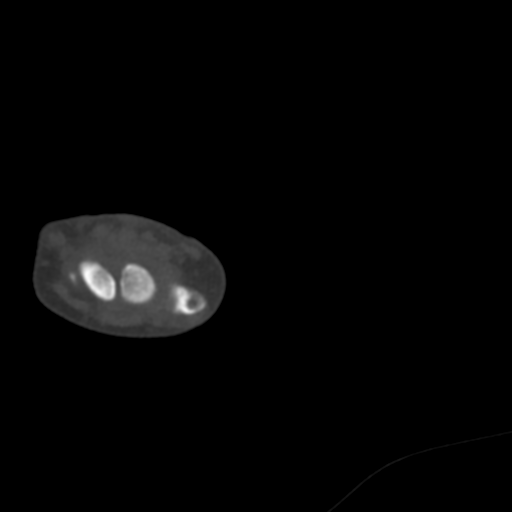

[Series 21: cor st · coronal · left · 0.25mm/px · 3 of 37 slices shown]
[im 8/37  bone]
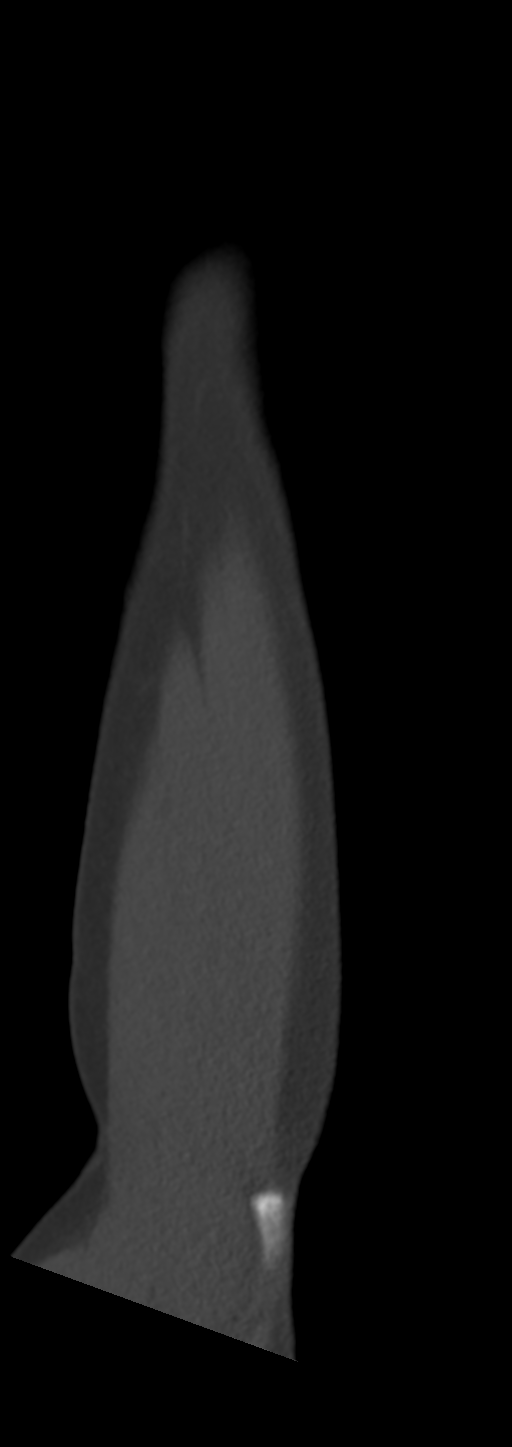
[im 15/37  bone]
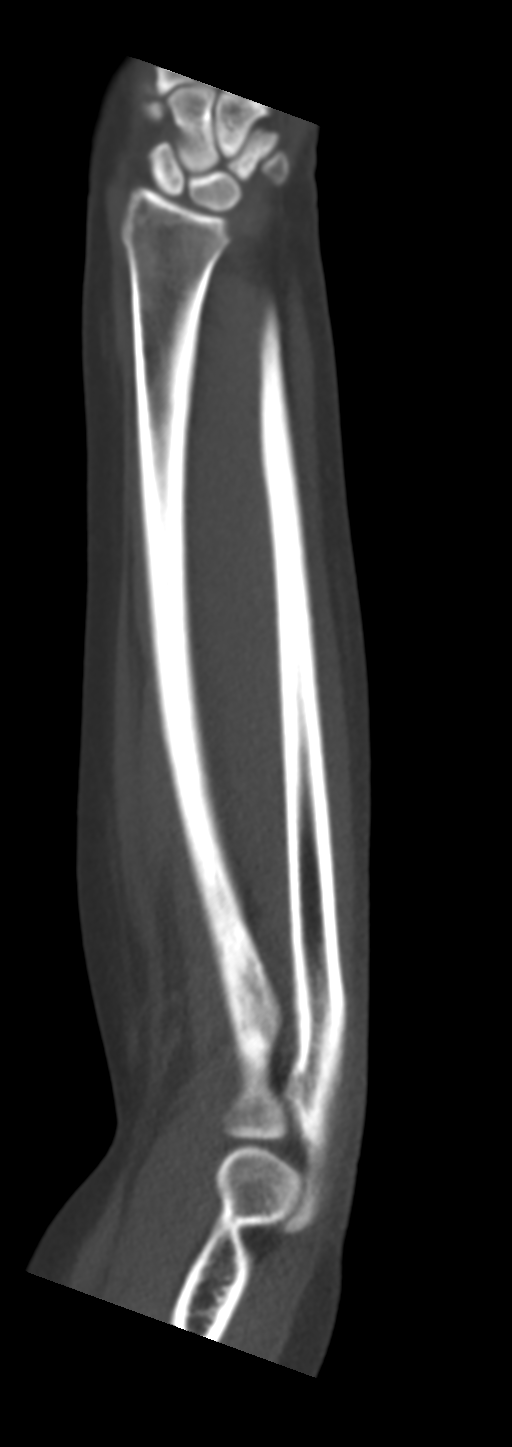
[im 22/37  bone]
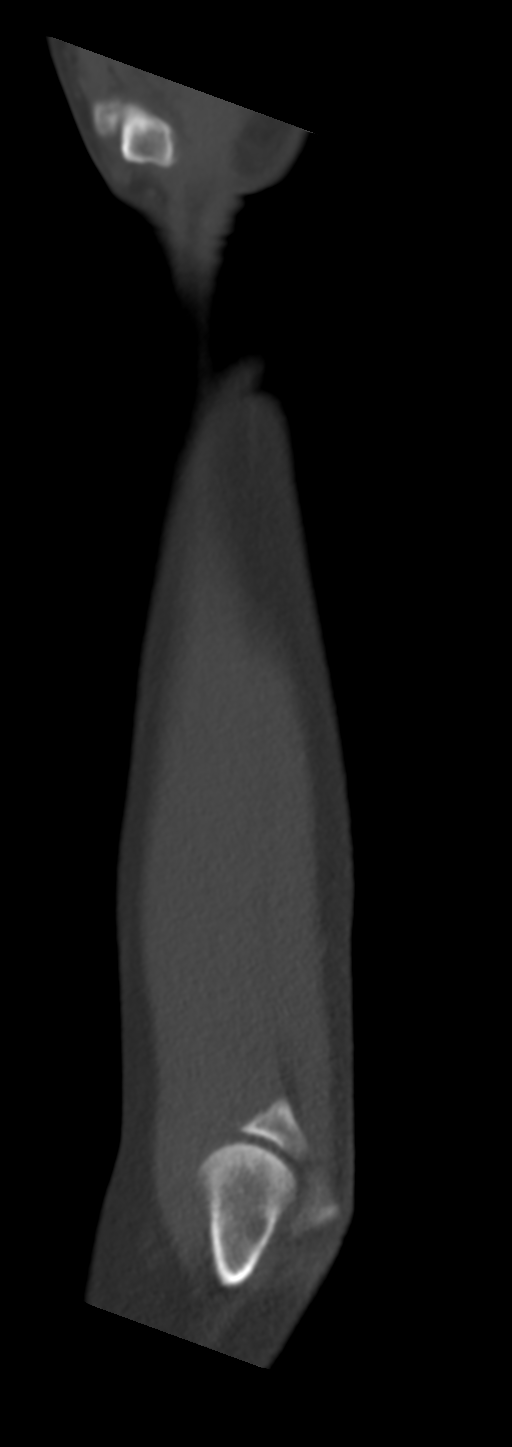

[11 of 36 positions shown; findings below may reference images not displayed]

FINDINGS: Bones/Joint/Cartilage

There is no evidence of acute fracture or dislocation. The joint
spaces appear maintained at the wrist and elbow. No significant
elbow joint effusion. Small intraosseous cyst noted in the capitate.

Ligaments

Suboptimally assessed by CT.

Muscles and Tendons

Unremarkable.

Soft tissues

Unremarkable. No evidence of soft tissue mass, fluid collection,
inflammation or foreign body.
IMPRESSION: Normal noncontrast CT of the left forearm.

## 2019-08-23 IMAGING — CT CT FOREARM*R* W/O CM
1 of 2 series · 10 of 14 positions shown, 13 images · non-contrast
Comparison: None.

CLINICAL DATA: Bilateral forearm pain.  No known injury.

EXAM:
CT OF THE RIGHT FOREARM WITHOUT CONTRAST
TECHNIQUE: Multidetector CT imaging was performed according to the standard
protocol. Multiplanar CT image reconstructions were also generated.

[Series 29: right arm thins st · axial · right · 0.38mm/px · z∈[+812,+1052]mm · 10 of 587 slices shown, 13 images]
[im 54/587  soft-tissue]
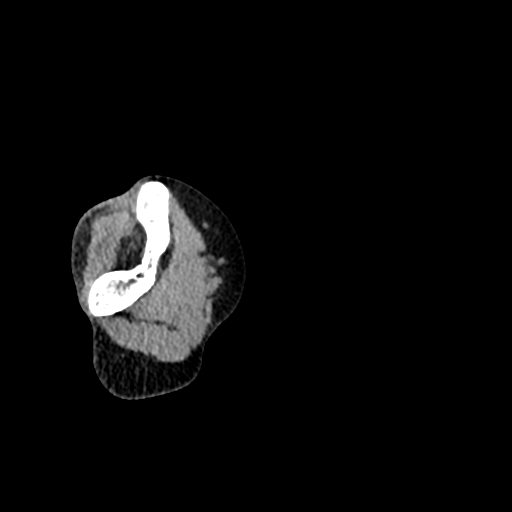
[im 54/587  bone]
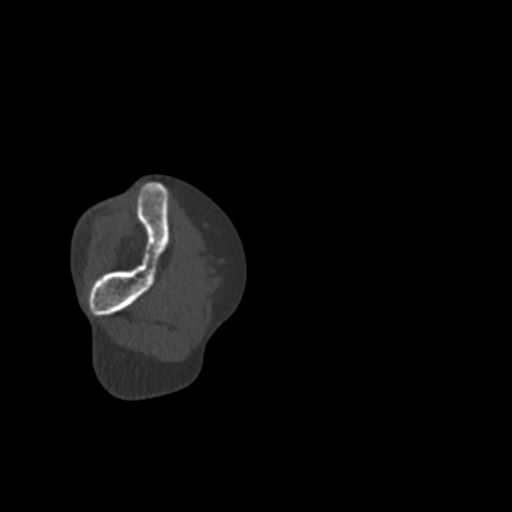
[im 107/587  bone]
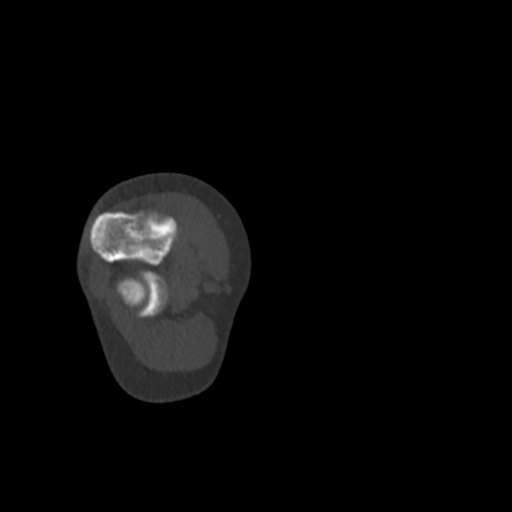
[im 160/587  bone]
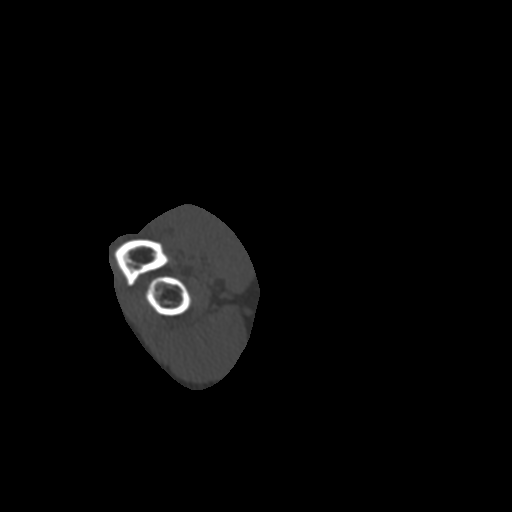
[im 214/587  bone]
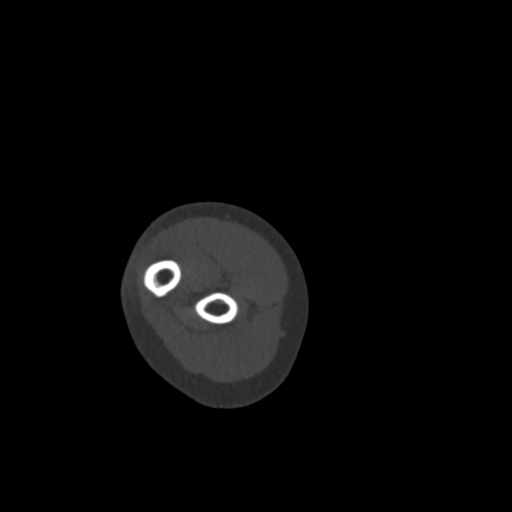
[im 267/587  soft-tissue]
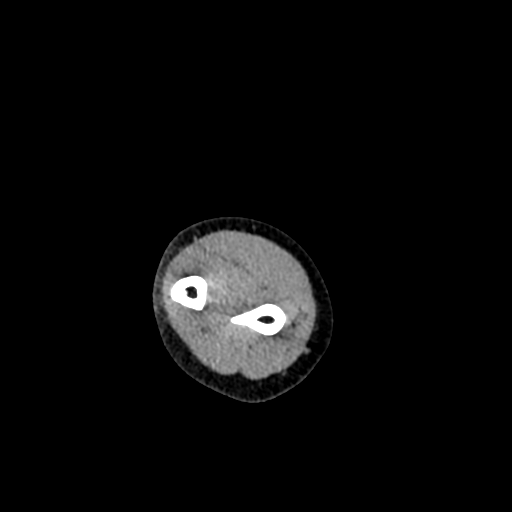
[im 267/587  bone]
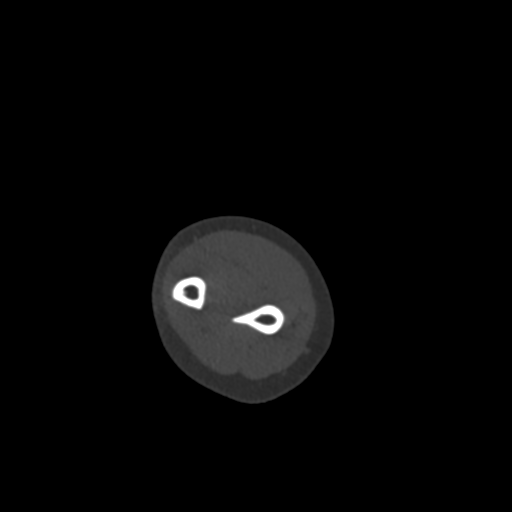
[im 320/587  bone]
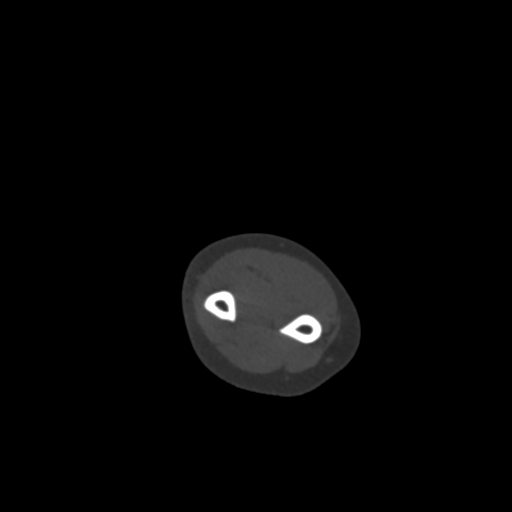
[im 373/587  bone]
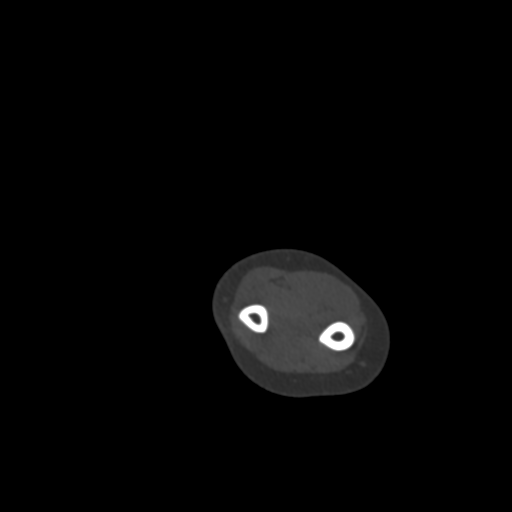
[im 427/587  bone]
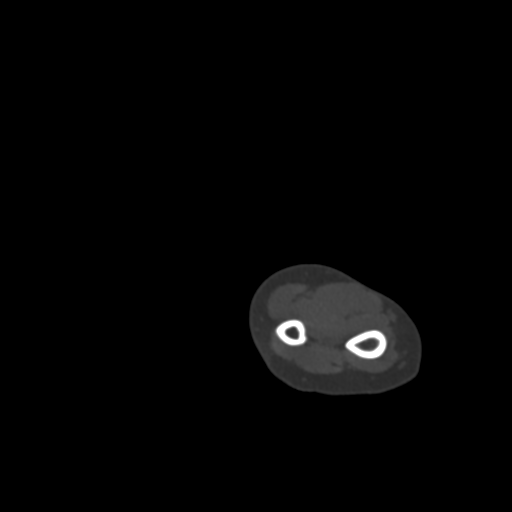
[im 480/587  soft-tissue]
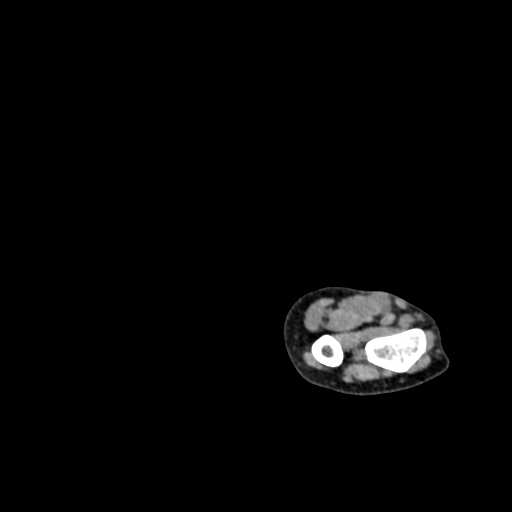
[im 480/587  bone]
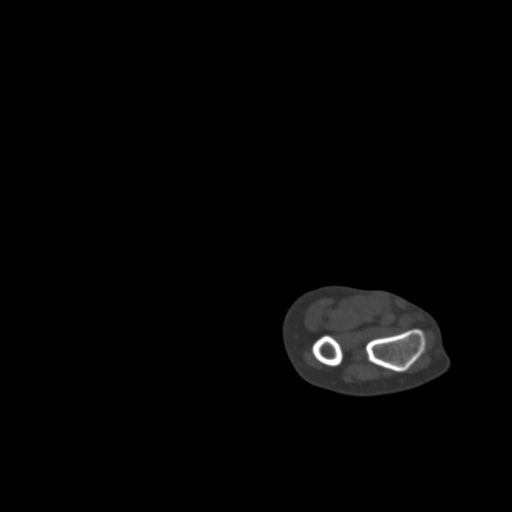
[im 533/587  bone]
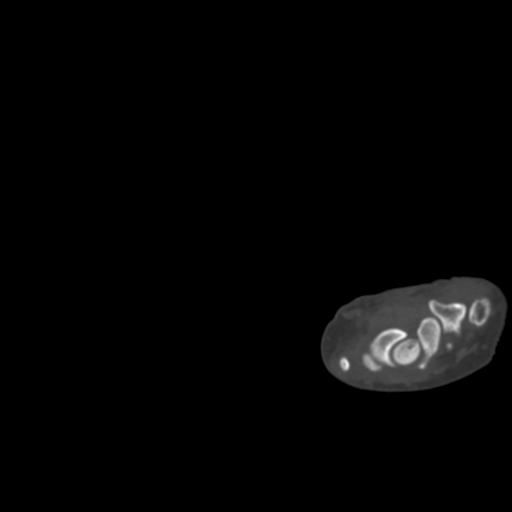

[10 of 14 positions shown; findings below may reference images not displayed]

FINDINGS: Bones/Joint/Cartilage

No evidence of acute fracture or dislocation. The joint spaces
appear preserved at the right wrist and elbow. No significant elbow
joint effusion. Tiny intraosseous cyst proximally in the capitate.

Ligaments

Suboptimally assessed by CT.

Muscles and Tendons

Unremarkable.

Soft tissues

Unremarkable. No inflammatory changes, fluid collections or foreign
bodies are seen.
IMPRESSION: Normal CT of the right forearm.

## 2019-08-28 ENCOUNTER — Ambulatory Visit (HOSPITAL_COMMUNITY)
Admission: RE | Admit: 2019-08-28 | Discharge: 2019-08-28 | Disposition: A | Discharge status: Home / Self Care | Payer: Commercial Managed Care - PPO | Source: Ambulatory Visit | Attending: Orthopedic Surgery | Admitting: Orthopedic Surgery

## 2019-08-28 ENCOUNTER — Other Ambulatory Visit: Payer: Self-pay

## 2019-08-28 DIAGNOSIS — M79632 Pain in left forearm: Secondary | ICD-10-CM | POA: Diagnosis present

## 2019-08-28 DIAGNOSIS — M79631 Pain in right forearm: Secondary | ICD-10-CM

## 2019-08-28 IMAGING — MR MR FOREARM*R* W/O CM
4 of 6 series · 19 of 40 positions shown · non-contrast
Comparison: CT [DATE]

CLINICAL DATA: Right forearm pain.  No known injury.

EXAM:
MRI OF THE RIGHT FOREARM WITHOUT CONTRAST
TECHNIQUE: Multiplanar, multisequence MR imaging of the right forearm was
performed. No intravenous contrast was administered.

[Series 4: T1 · coronal · 4.0mm · 0.62mm/px · 3 of 23 slices shown (1 of 2)]
[im 1/23]
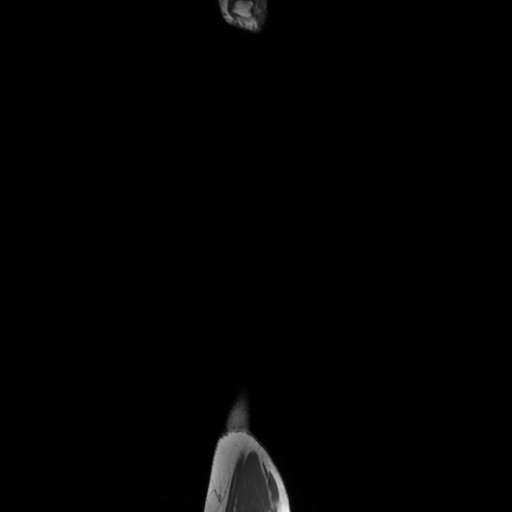
[im 12/23]
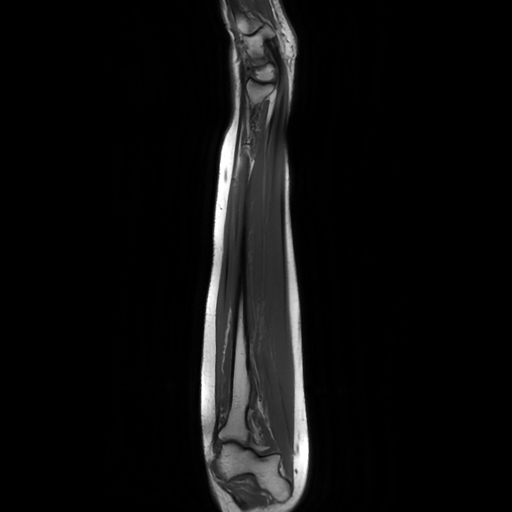
[im 23/23]
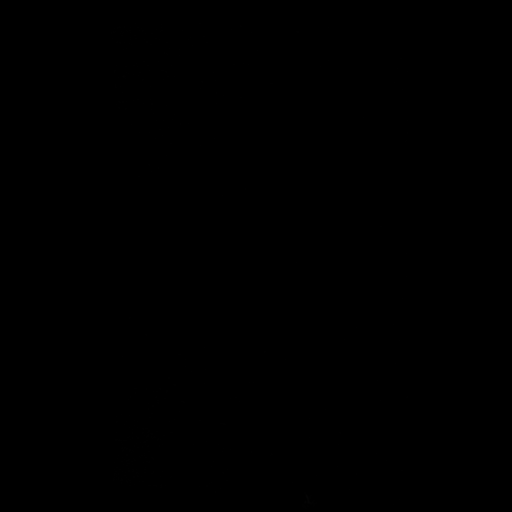

[Series 5: T2 fat-sat · coronal · 4.0mm · 0.62mm/px · 5 of 26 slices shown (1 of 2)]
[im 1/26]
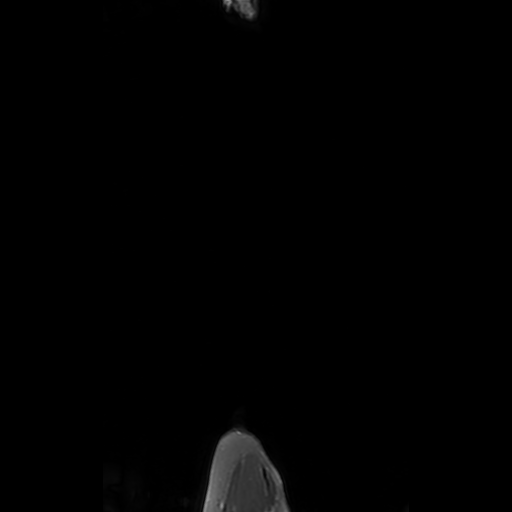
[im 7/26]
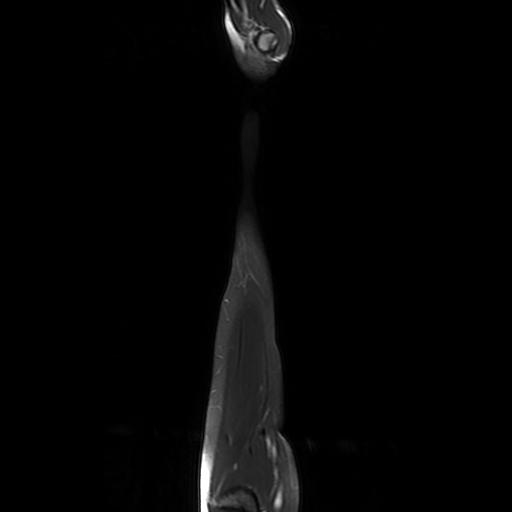
[im 13/26]
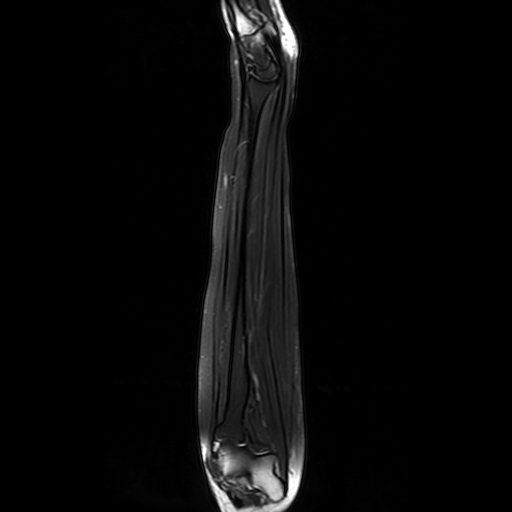
[im 19/26]
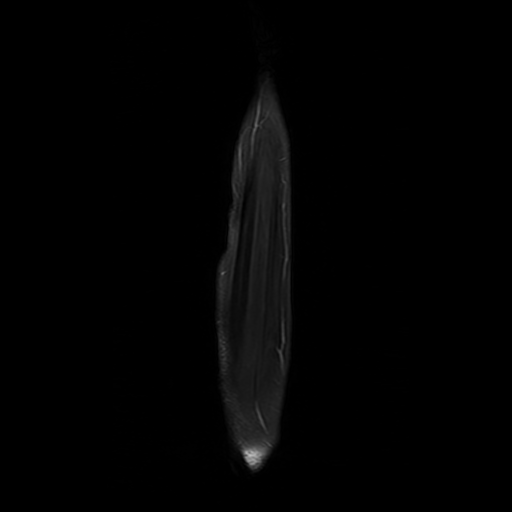
[im 26/26]
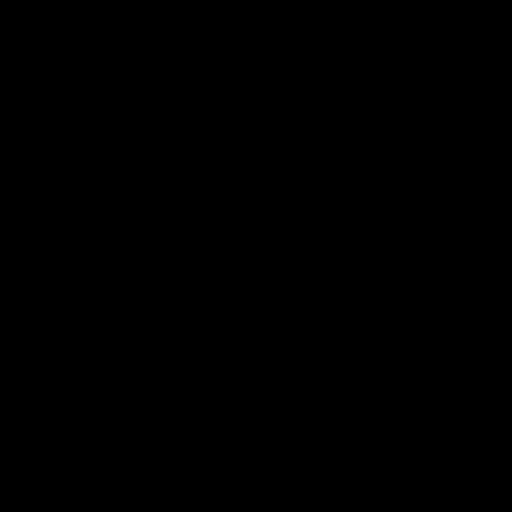

[Series 6: T1 · axial · 5.0mm · 0.25mm/px · z∈[-165,+93]mm · 3 of 55 slices shown (2 of 2)]
[im 6/55]
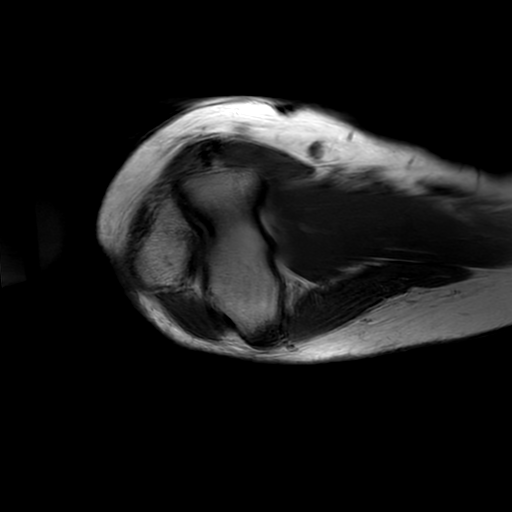
[im 28/55]
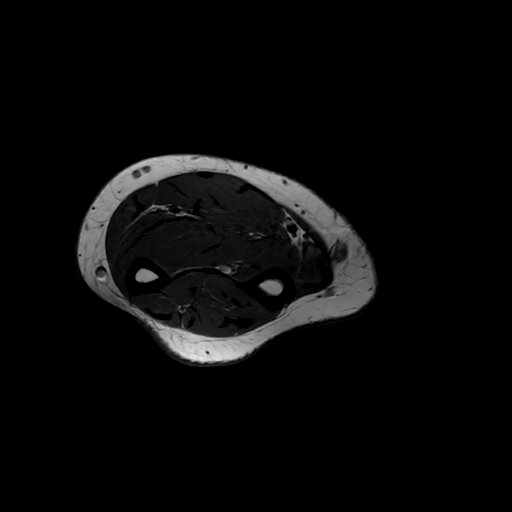
[im 49/55]
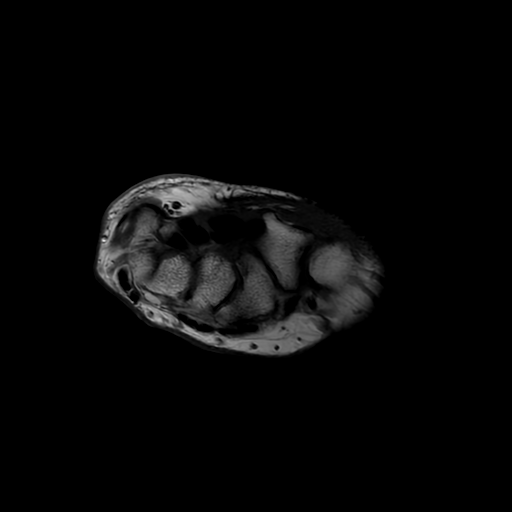

[Series 7: T2 fat-sat · axial · 5.0mm · 0.25mm/px · z∈[-201,+93]mm · 8 of 56 slices shown (2 of 2)]
[im 1/56]
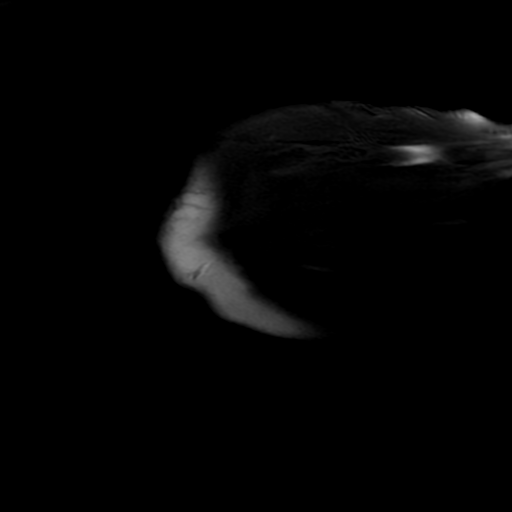
[im 6/56]
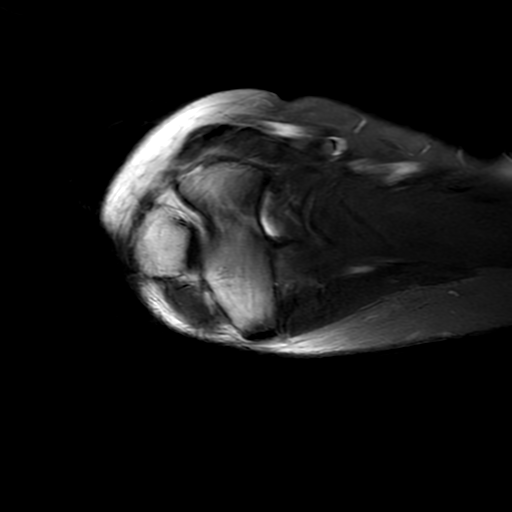
[im 12/56]
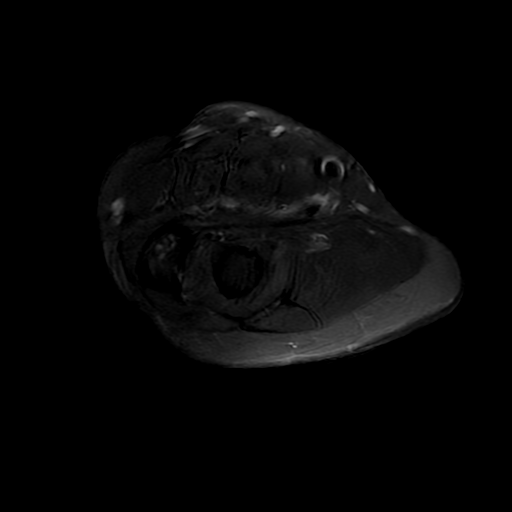
[im 17/56]
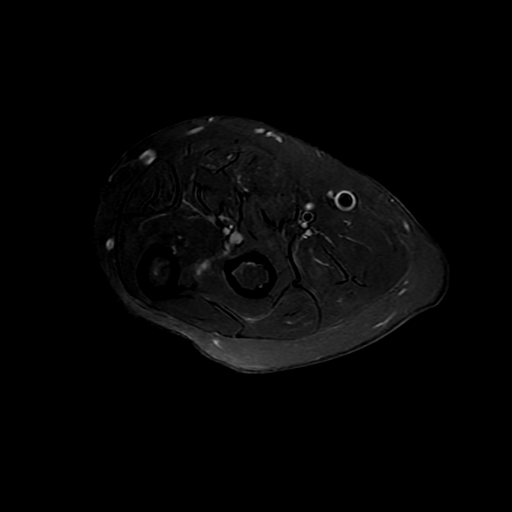
[im 23/56]
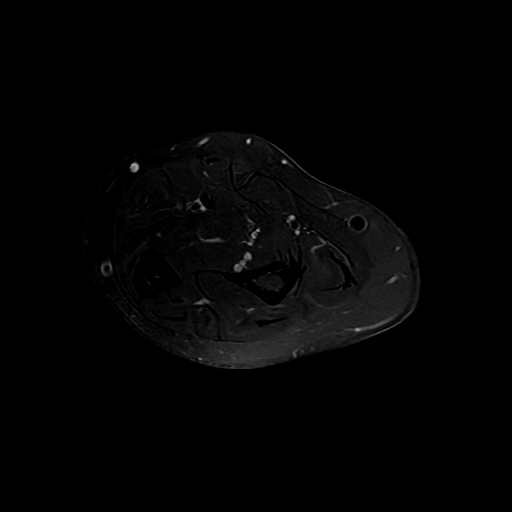
[im 28/56]
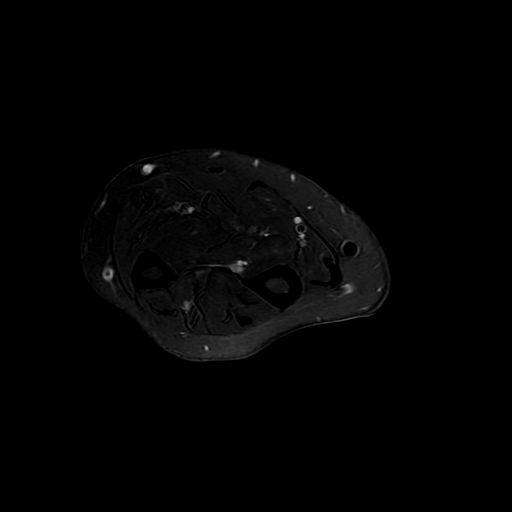
[im 34/56]
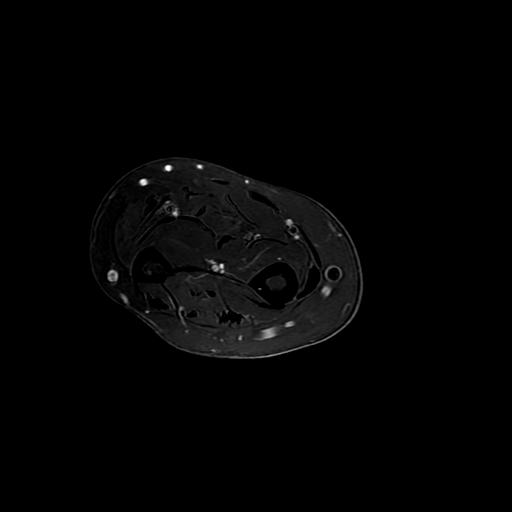
[im 50/56]
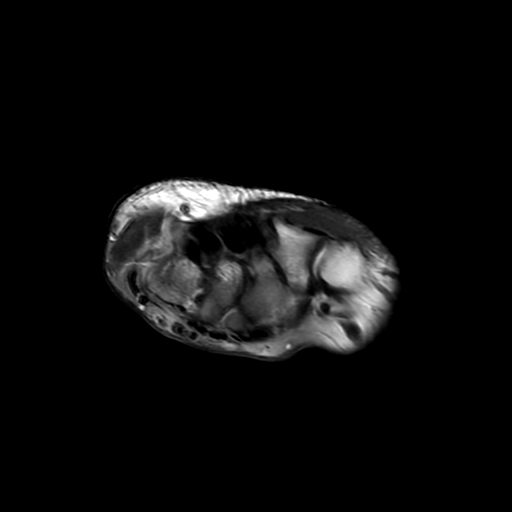

[19 of 40 positions shown; findings below may reference images not displayed]

FINDINGS: Bones/Joint/Cartilage

No acute fracture. No dislocation. No bone marrow edema. No
suspicious bone lesion. Trace elbow joint effusion. No appreciable
elbow joint arthropathy. No discrete chondral defect is seen on
large field of view imaging. No appreciable abnormality of the right
wrist. No wrist joint effusion.

Ligaments

Grossly intact.

Muscles and Tendons

Normal muscle bulk and signal without intramuscular edema, atrophy,
or fatty infiltration. Minimal tendinosis at the common extensor
tendon origin (series 7, image 7) without evidence of tear or
peritendinous edema. Remaining tendinous structures of the forearm
appear intact. No tenosynovial fluid.

Soft tissues

No soft tissue edema. No soft tissue mass or fluid collection.
IMPRESSION: Right forearm:

1. Minimal tendinosis at the common extensor tendon origin without
evidence of tear or peritendinous edema.
2. Trace elbow joint effusion, nonspecific.

## 2019-08-28 IMAGING — MR MR FOREARM*L* W/O CM
4 of 6 series · 19 of 40 positions shown · non-contrast
Comparison: CT [DATE]

CLINICAL DATA: Left forearm pain.  No known injury.

EXAM:
MRI OF THE LEFT FOREARM WITHOUT CONTRAST
TECHNIQUE: Multiplanar, multisequence MR imaging of the left forearm was
performed. No intravenous contrast was administered.

[Series 4: T1 · sagittal · 4.0mm · 0.62mm/px · 3 of 25 slices shown (1 of 2)]
[im 1/25]
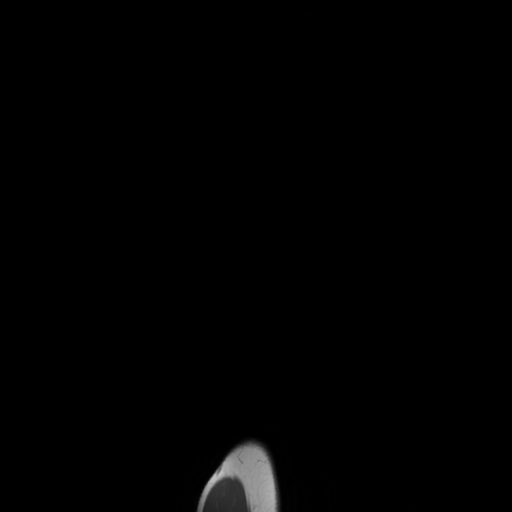
[im 13/25]
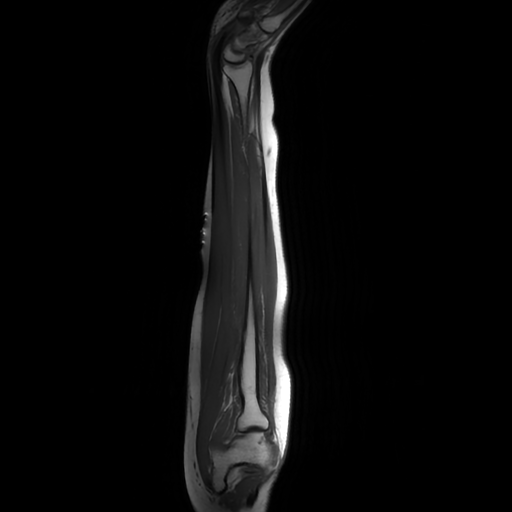
[im 25/25]
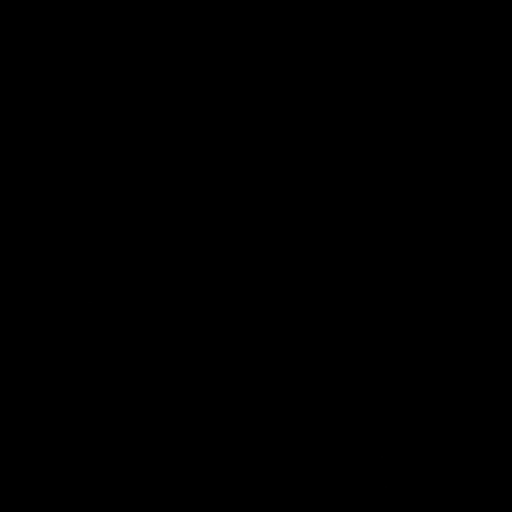

[Series 5: T2 fat-sat · sagittal · 4.0mm · 0.62mm/px · 5 of 25 slices shown (1 of 2)]
[im 1/25]
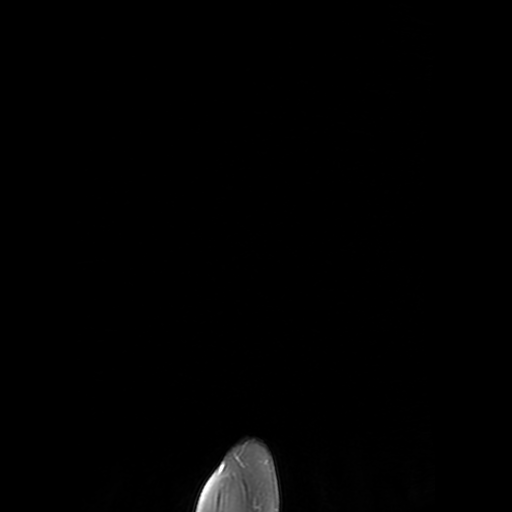
[im 7/25]
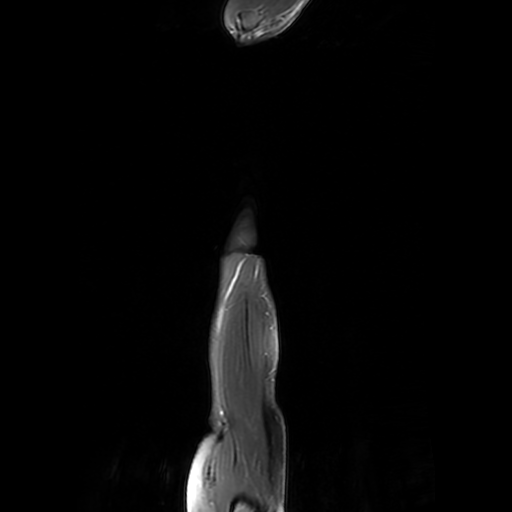
[im 13/25]
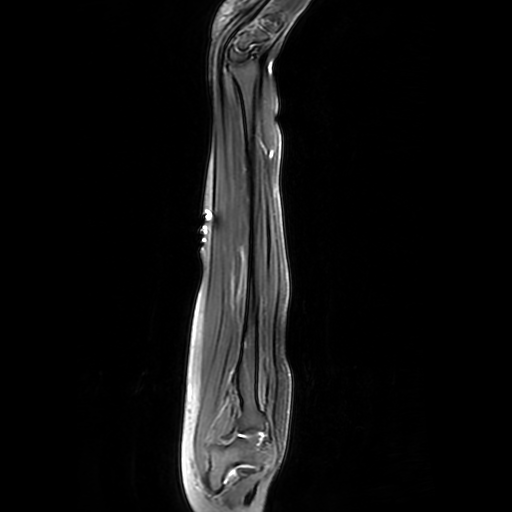
[im 19/25]
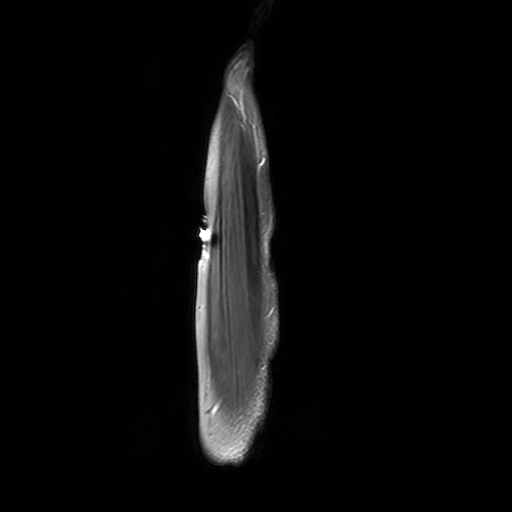
[im 25/25]
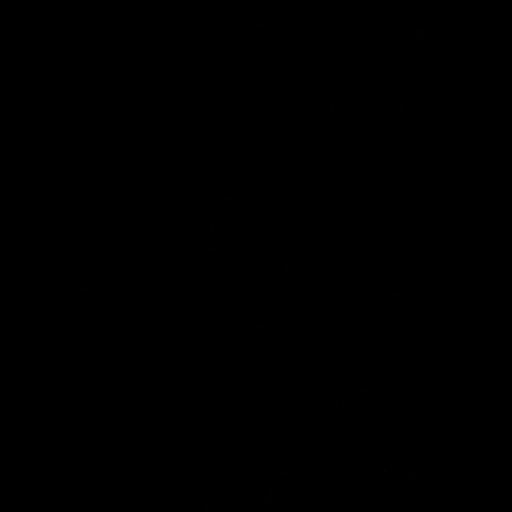

[Series 6: T1 · axial · 5.0mm · 0.25mm/px · z∈[-98,+154]mm · 3 of 54 slices shown (2 of 2)]
[im 6/54]
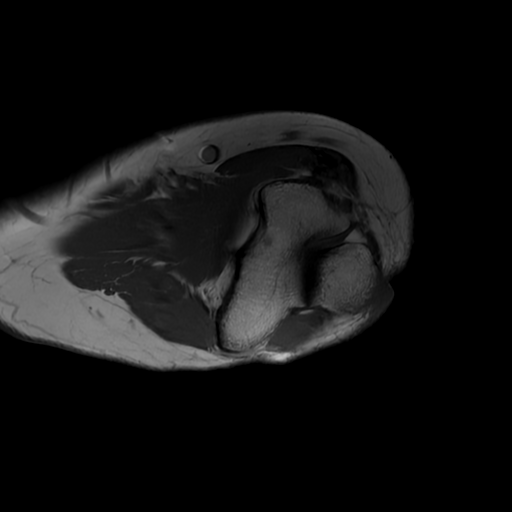
[im 27/54]
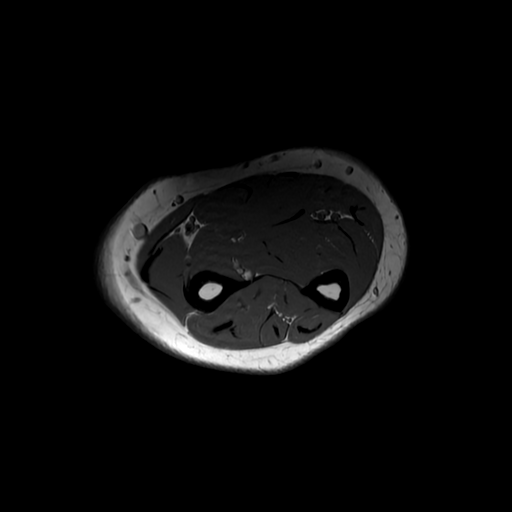
[im 48/54]
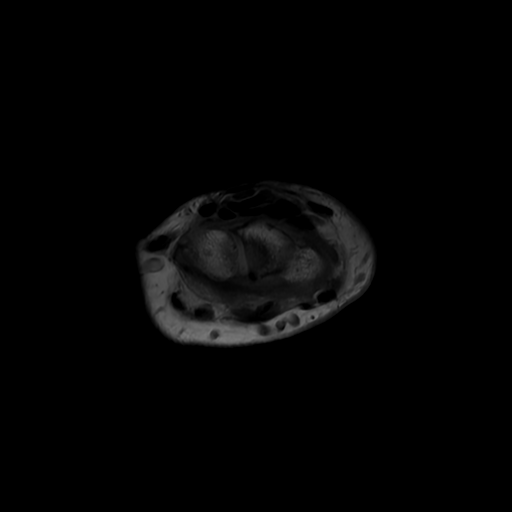

[Series 7: T2 fat-sat · axial · 5.0mm · 0.25mm/px · z∈[-128,+154]mm · 8 of 54 slices shown (2 of 2)]
[im 1/54]
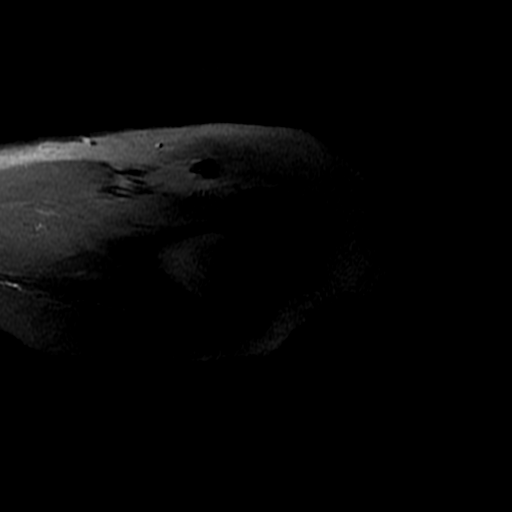
[im 6/54]
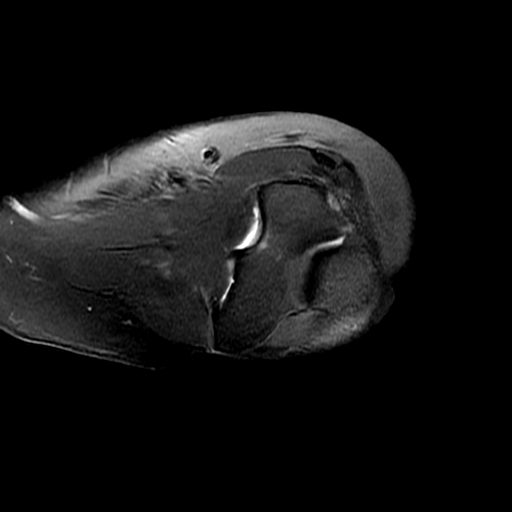
[im 11/54]
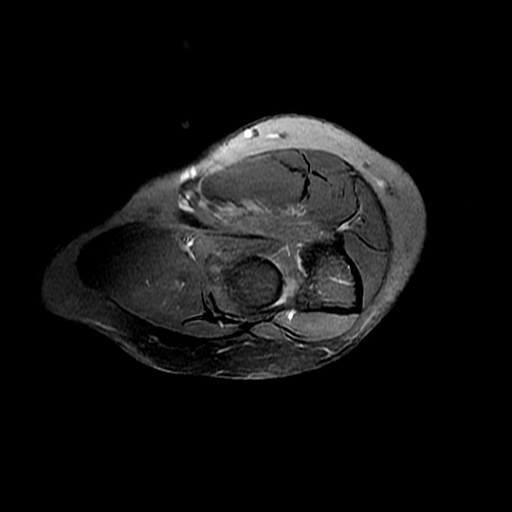
[im 16/54]
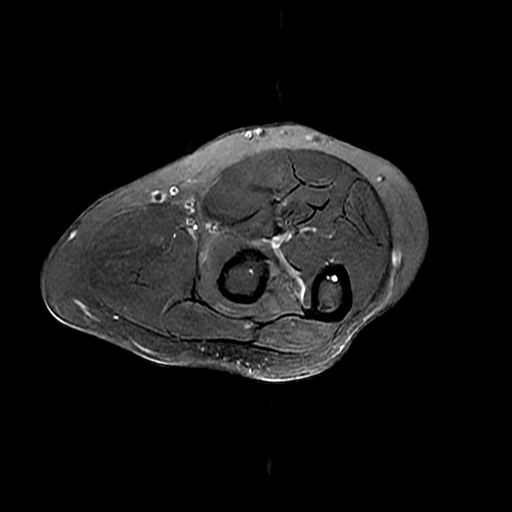
[im 22/54]
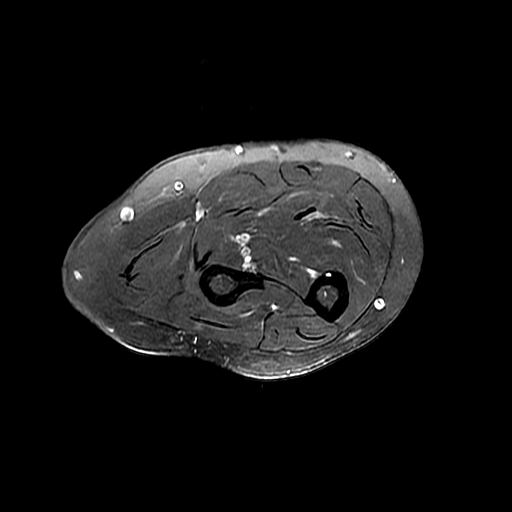
[im 27/54]
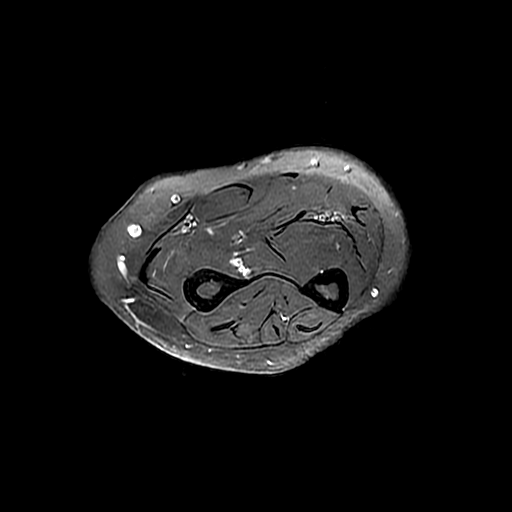
[im 32/54]
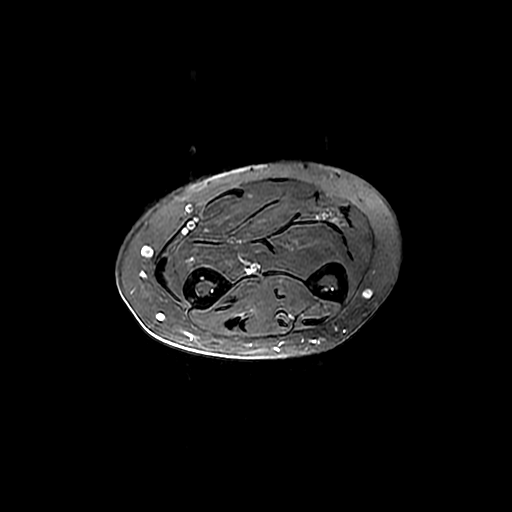
[im 48/54]
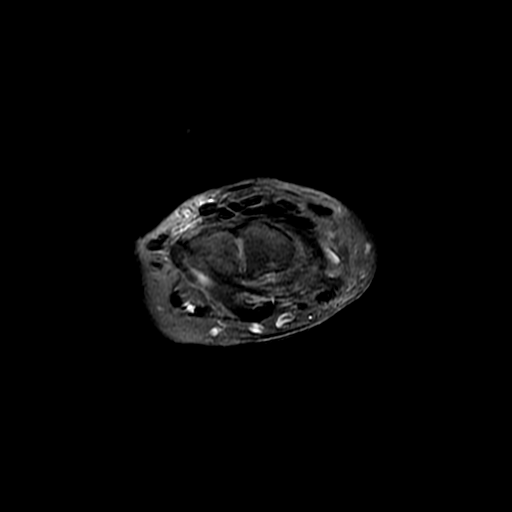

[19 of 40 positions shown; findings below may reference images not displayed]

FINDINGS: Bones/Joint/Cartilage

No acute fracture. No dislocation. No bone marrow edema. No
suspicious bone lesion. Trace elbow joint effusion. Minimal
ulnotrochlear arthropathy. No discrete chondral defect is seen on
large field of view imaging. No appreciable abnormality of the left
wrist. No wrist joint effusion.

Ligaments

Grossly intact.

Muscles and Tendons

Normal muscle bulk and signal without intramuscular edema, atrophy,
or fatty infiltration. Minimal tendinosis at the common extensor
tendon origin (series 7, image 8) without evidence of tear or
peritendinous edema. Remaining tendinous structures of the forearm
appear intact. No tenosynovial fluid.

Soft tissues

No soft tissue edema.  No soft tissue mass or fluid collection.
IMPRESSION: Left forearm:

1. Minimal tendinosis at the common extensor tendon origin without
evidence of tear or peritendinous edema.
2. Trace elbow joint effusion, nonspecific.
3. Minimal ulnotrochlear arthropathy.

## 2019-10-20 ENCOUNTER — Other Ambulatory Visit: Payer: Self-pay | Admitting: Nurse Practitioner

## 2019-10-20 DIAGNOSIS — Z1231 Encounter for screening mammogram for malignant neoplasm of breast: Secondary | ICD-10-CM

## 2019-11-03 ENCOUNTER — Other Ambulatory Visit: Payer: Self-pay

## 2019-11-03 ENCOUNTER — Ambulatory Visit
Admission: RE | Admit: 2019-11-03 | Discharge: 2019-11-03 | Disposition: A | Discharge status: Home / Self Care | Payer: Commercial Managed Care - PPO | Source: Ambulatory Visit | Attending: Nurse Practitioner | Admitting: Nurse Practitioner

## 2019-11-03 DIAGNOSIS — Z1231 Encounter for screening mammogram for malignant neoplasm of breast: Secondary | ICD-10-CM

## 2019-11-03 IMAGING — MG DIGITAL SCREENING BREAST BILAT IMPLANT W/ TOMO W/ CAD
9 of 12 series · 9 of 28 positions shown · non-contrast
Comparison: Previous exam(s).

CLINICAL DATA: Screening.

EXAM:
DIGITAL SCREENING BILATERAL MAMMOGRAM WITH IMPLANTS, CAD AND TOMO
The patient has retropectoral implants. Standard and implant
displaced views were performed.

[R MLO]
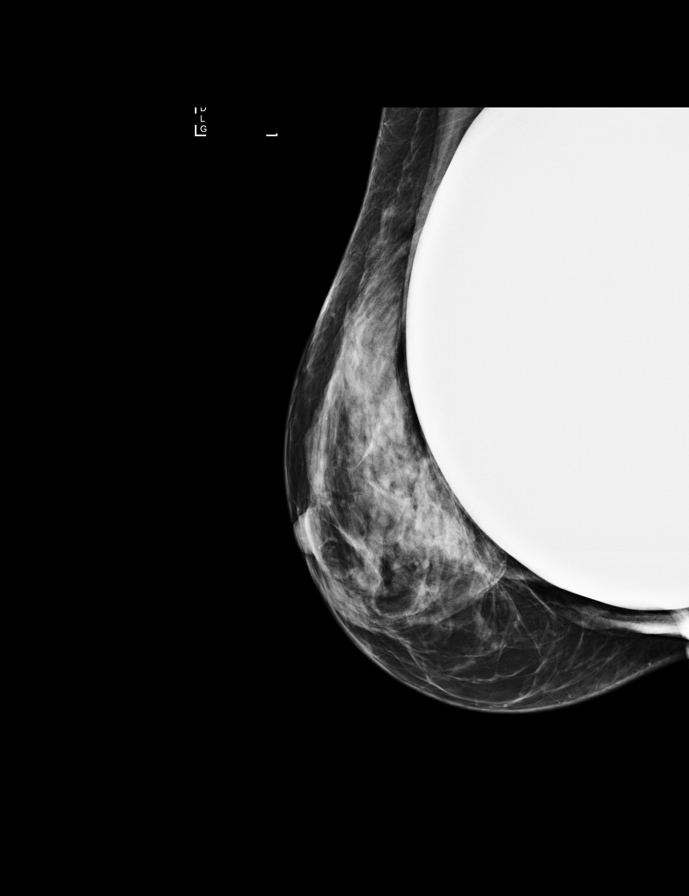

[R CC]
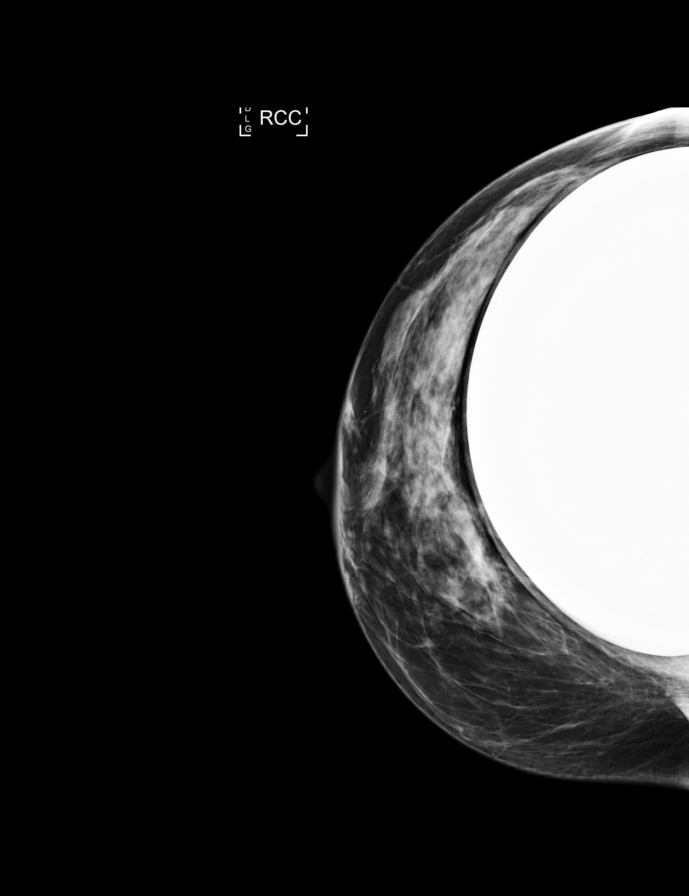

[L CC]
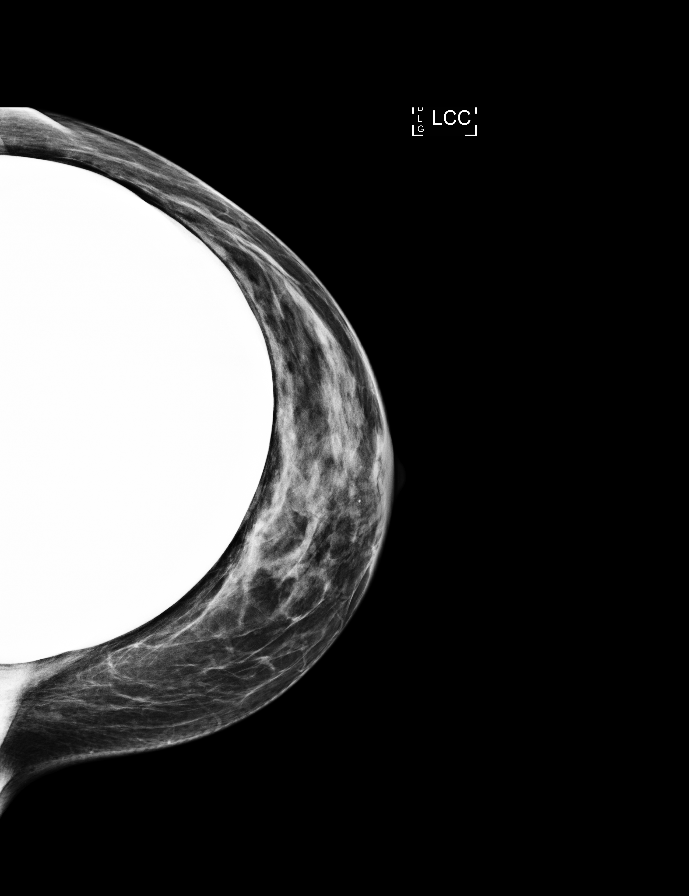

[L MLO]
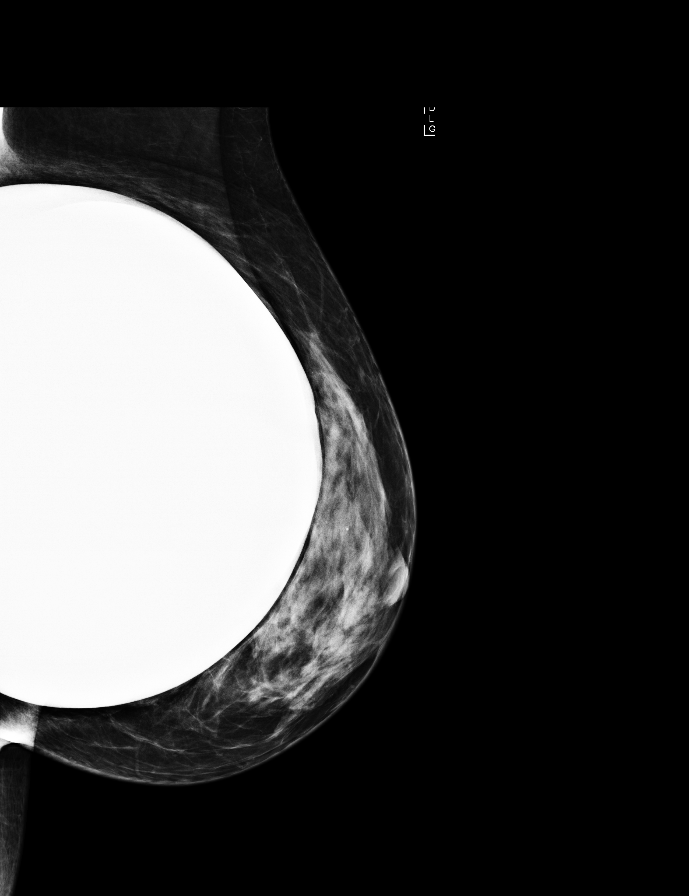

[L CC synth-2D]
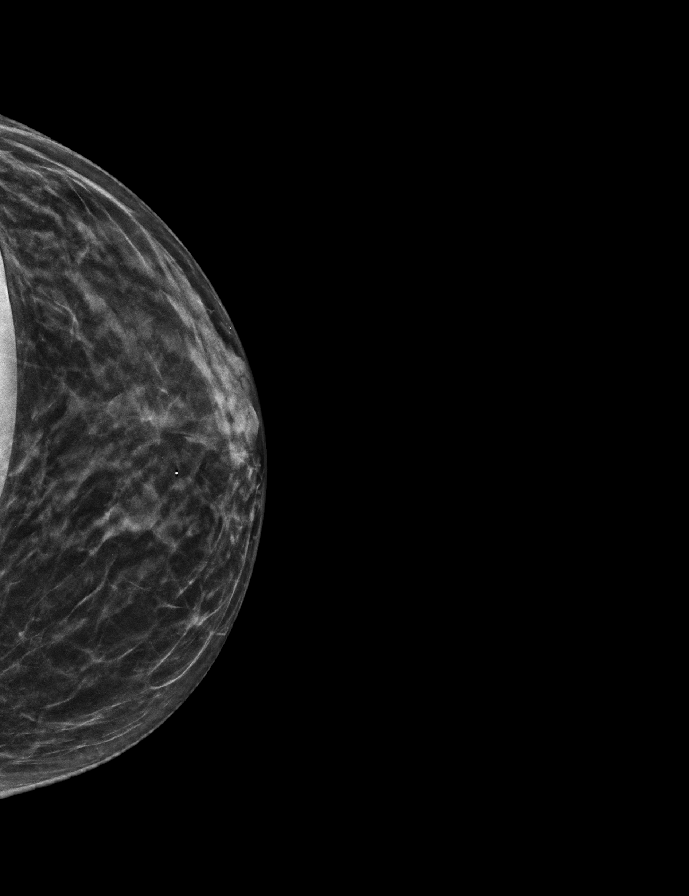

[L MLO synth-2D]
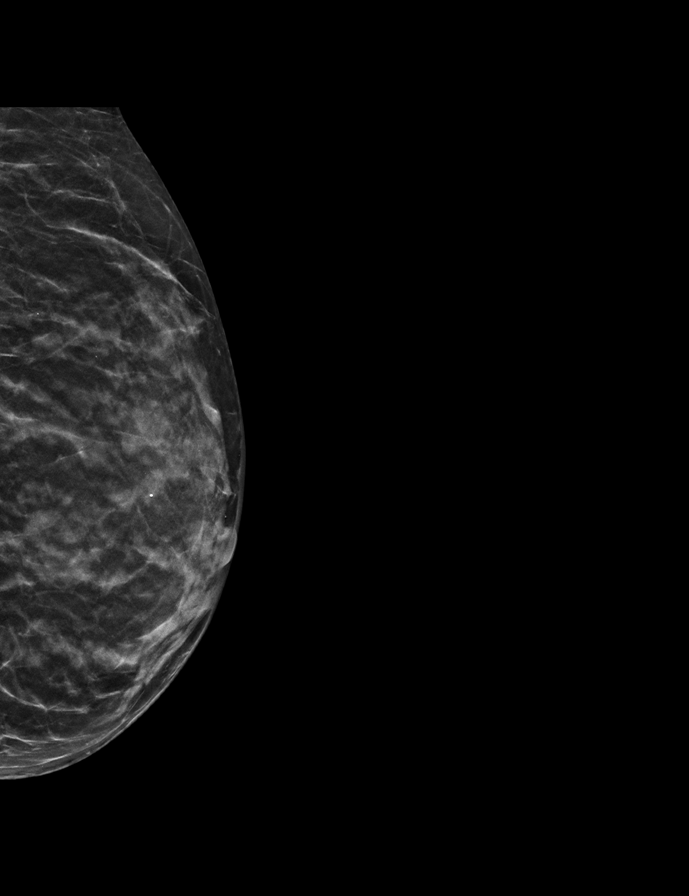

[R CC synth-2D]
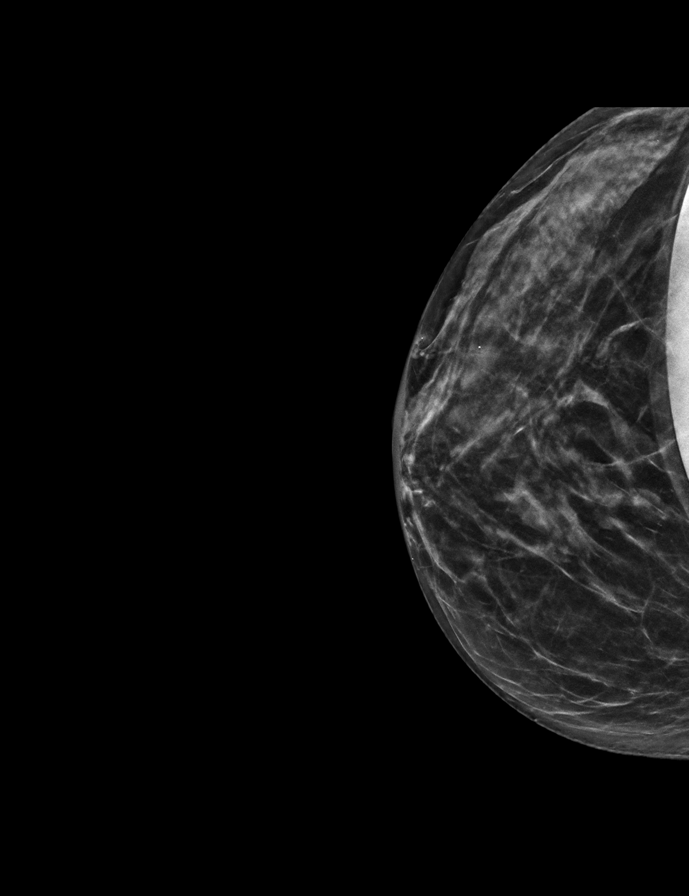

[R MLO synth-2D]
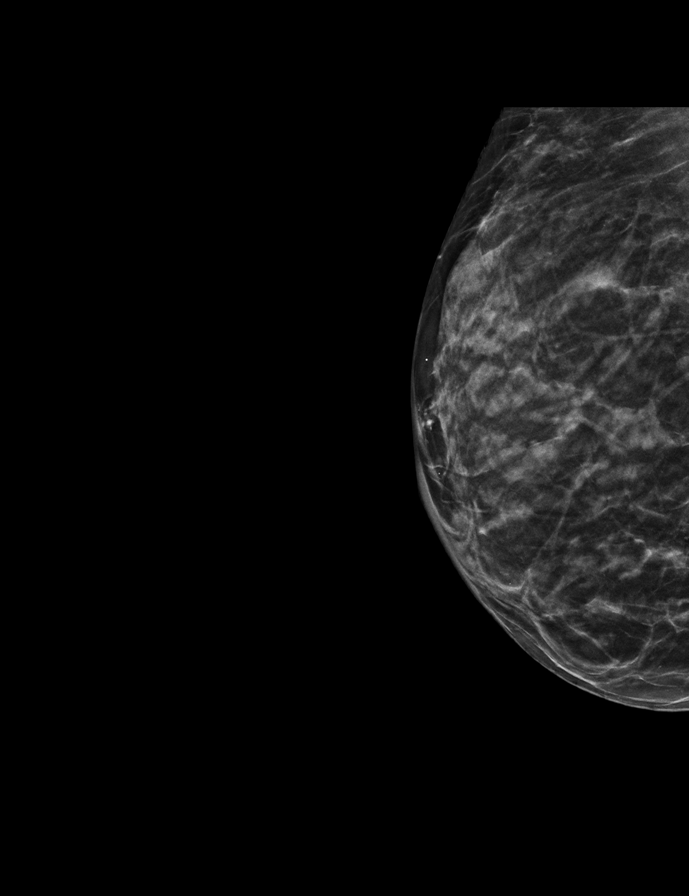

[L MLOID BREAST TOMOSYNTHESIS IMAGE tomo · tomo slice 18/35.0]
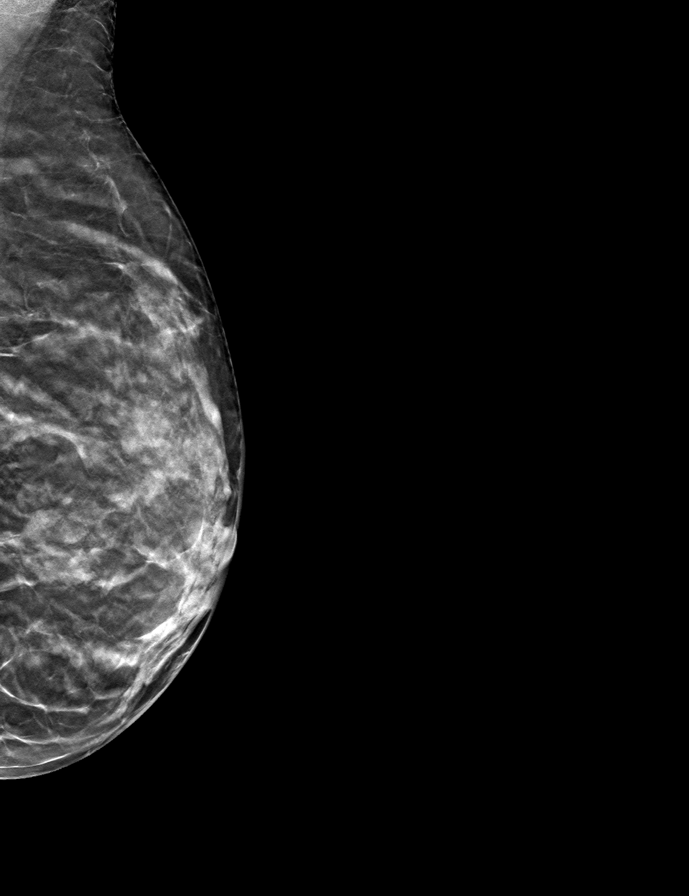

[9 of 28 positions shown; findings below may reference images not displayed]

ACR Breast Density Category c: The breast tissue is heterogeneously
dense, which may obscure small masses.
FINDINGS: Bilateral retropectoral silicone implants are intact.

There are no findings suspicious for malignancy. Images were
processed with CAD.
IMPRESSION: No mammographic evidence of malignancy. A result letter of this
screening mammogram will be mailed directly to the patient.

RECOMMENDATION:
Screening mammogram in one year. (Code:[DK])

BI-RADS CATEGORY  1:  Negative.

## 2020-05-04 ENCOUNTER — Other Ambulatory Visit: Payer: Self-pay

## 2020-05-04 ENCOUNTER — Ambulatory Visit (INDEPENDENT_AMBULATORY_CARE_PROVIDER_SITE_OTHER): Payer: Commercial Managed Care - PPO | Admitting: Family Medicine

## 2020-05-04 ENCOUNTER — Encounter: Payer: Self-pay | Admitting: Family Medicine

## 2020-05-04 VITALS — BP 100/68 | HR 75 | Temp 97.5°F | Wt 124.6 lb

## 2020-05-04 DIAGNOSIS — R7303 Prediabetes: Secondary | ICD-10-CM | POA: Diagnosis not present

## 2020-05-04 DIAGNOSIS — N951 Menopausal and female climacteric states: Secondary | ICD-10-CM | POA: Diagnosis not present

## 2020-05-04 NOTE — Progress Notes (Signed)
   Subjective:    Patient ID: Jeanette Ross, adult    DOB: 09-Sep-1965, 55 y.o.   MRN: [id number]  HPI Chief Complaint  Patient presents with  . follow up labs    She is here with questions regarding recent labs from [facility]. She would like a second opinion.  She has lab results with her today. States she is no longer on testosterone.  She was taking this for help with her libido.  OB/GYN- Dr Estanislado Pandy   She has a history of prediabetes.  Denies any other concerns.   Review of Systems Pertinent positives and negatives in the history of present illness.     Objective:   Physical Exam BP 100/68   Pulse 75   Temp (!) 97.5 F (36.4 C)   Wt 124 lb 9.6 oz (56.5 kg)   LMP 12/11/2017   SpO2 99%   BMI 22.07 kg/m   Alert and oriented in no acute distress.  Respirations unlabored.  Skin is warm and dry.  Normal speech, mood and thought process      Assessment & Plan:  Prediabetes  Menopausal symptoms  She has another PCP at [facility].  She also has an OB/GYN.  I reviewed her labs and answered all of her questions.  Discussed that she may return here for her next CPE when she is due.  Discussed the importance of having 1 primary care provider for continuity of care.

## 2020-06-29 DIAGNOSIS — M85852 Other specified disorders of bone density and structure, left thigh: Secondary | ICD-10-CM | POA: Insufficient documentation

## 2020-10-08 ENCOUNTER — Other Ambulatory Visit: Payer: Self-pay | Admitting: Obstetrics & Gynecology

## 2020-10-08 DIAGNOSIS — Z1231 Encounter for screening mammogram for malignant neoplasm of breast: Secondary | ICD-10-CM

## 2020-10-11 DIAGNOSIS — M810 Age-related osteoporosis without current pathological fracture: Secondary | ICD-10-CM

## 2020-10-11 HISTORY — DX: Age-related osteoporosis without current pathological fracture: M81.0

## 2020-11-09 ENCOUNTER — Ambulatory Visit: Payer: Commercial Managed Care - PPO

## 2020-11-23 ENCOUNTER — Other Ambulatory Visit: Payer: Self-pay

## 2020-11-23 ENCOUNTER — Ambulatory Visit
Admission: RE | Admit: 2020-11-23 | Discharge: 2020-11-23 | Disposition: A | Discharge status: Home / Self Care | Payer: Commercial Managed Care - PPO | Source: Ambulatory Visit | Attending: Obstetrics & Gynecology | Admitting: Obstetrics & Gynecology

## 2020-11-23 DIAGNOSIS — Z1231 Encounter for screening mammogram for malignant neoplasm of breast: Secondary | ICD-10-CM

## 2020-11-23 IMAGING — MG DIGITAL SCREENING BREAST BILAT IMPLANT W/ TOMO W/ CAD
8 of 12 series · 8 of 28 positions shown · non-contrast
Comparison: Previous exam(s).

CLINICAL DATA: Screening.

EXAM:
DIGITAL SCREENING BILATERAL MAMMOGRAM WITH IMPLANTS, CAD AND
TOMOSYNTHESIS
TECHNIQUE: Bilateral screening digital craniocaudal and mediolateral oblique
mammograms were obtained. Bilateral screening digital breast
tomosynthesis was performed. The images were evaluated with
computer-aided detection. Standard and/or implant displaced views
were performed.

[R MLO]
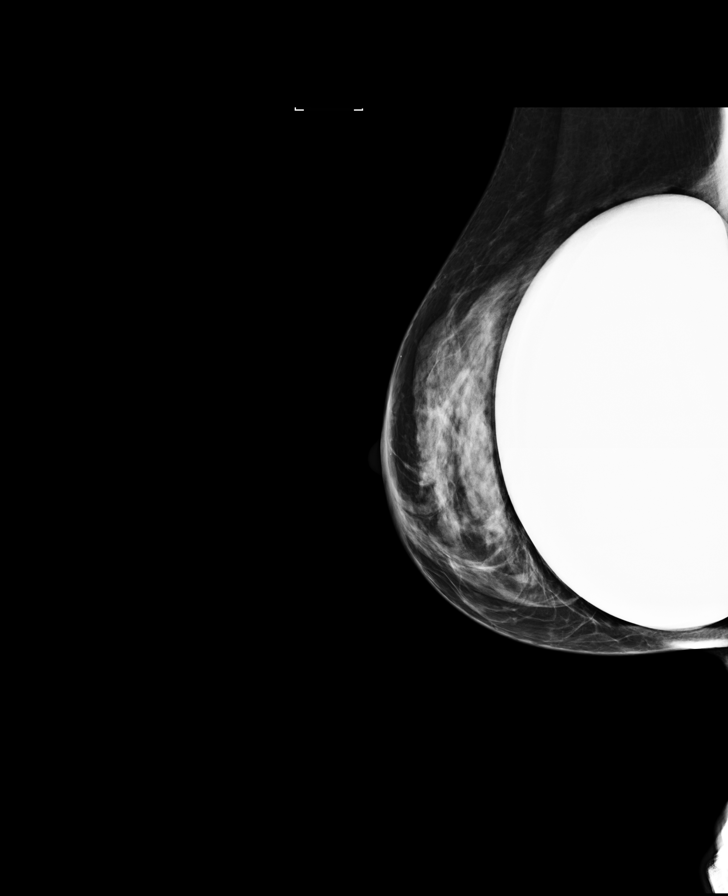

[R CC]
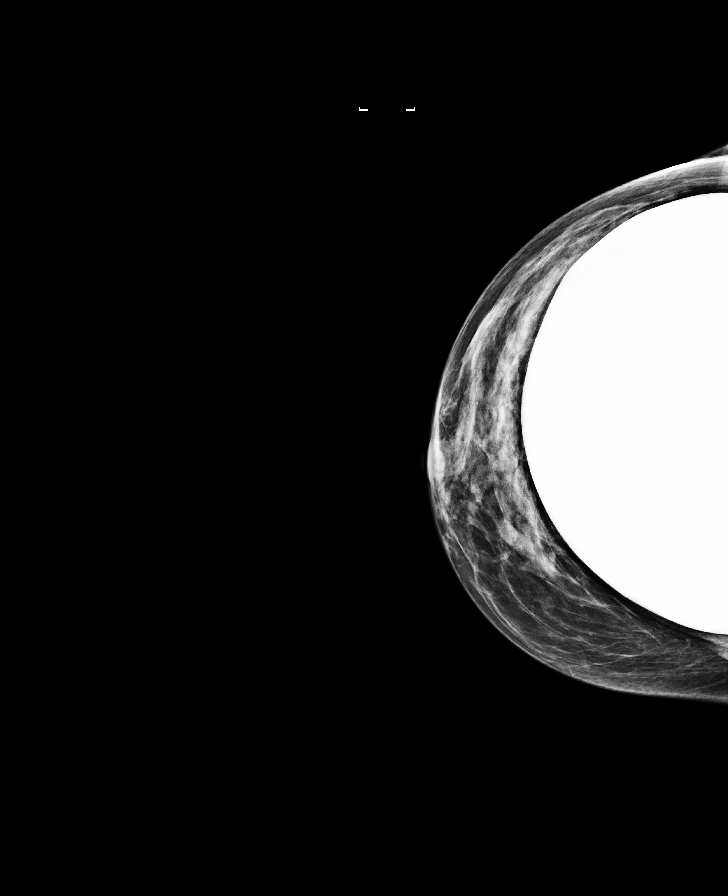

[L MLO]
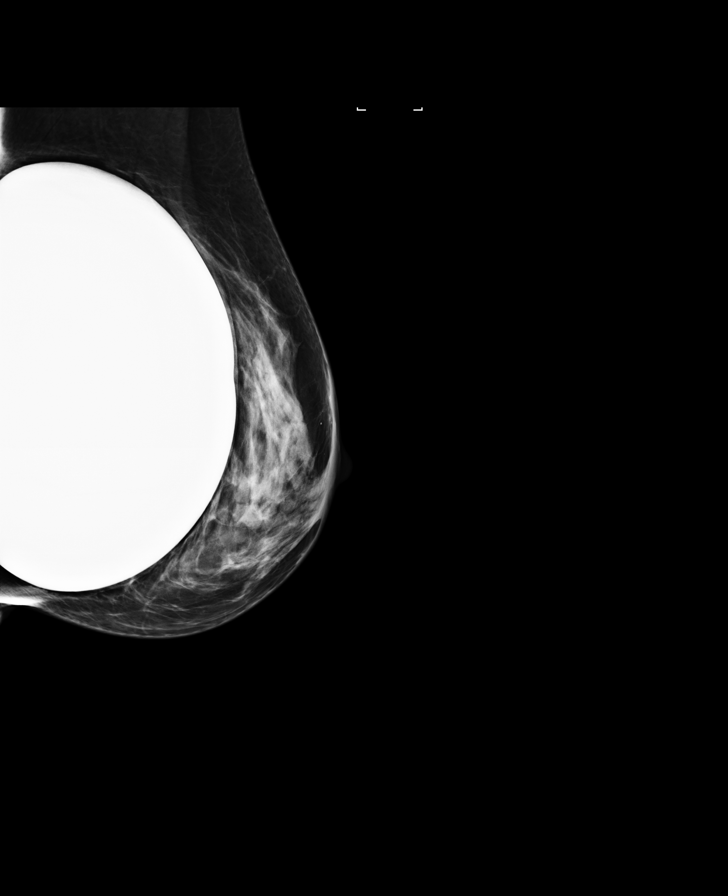

[L CC]
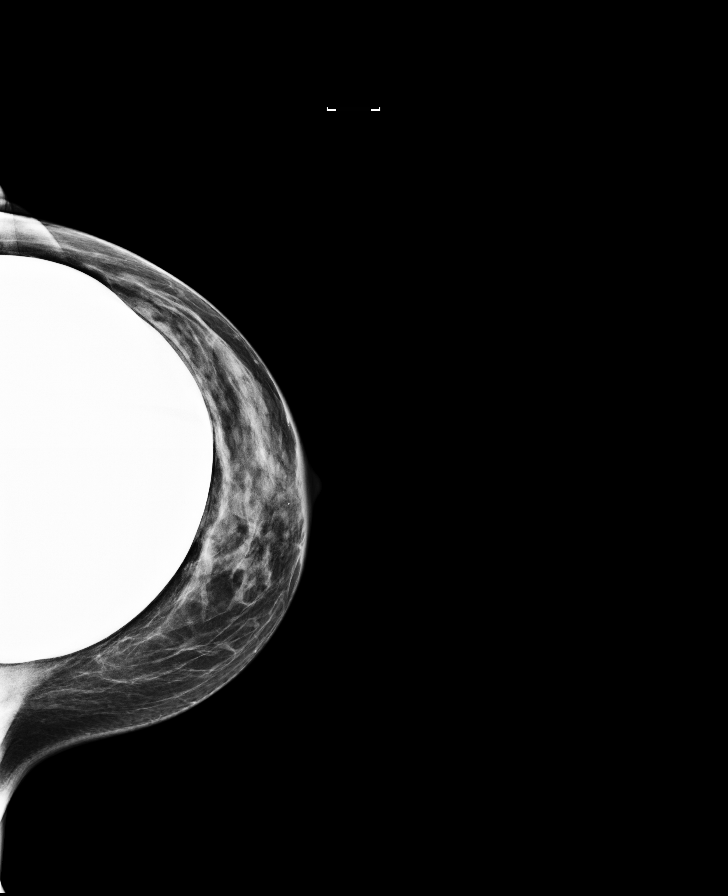

[L CC synth-2D]
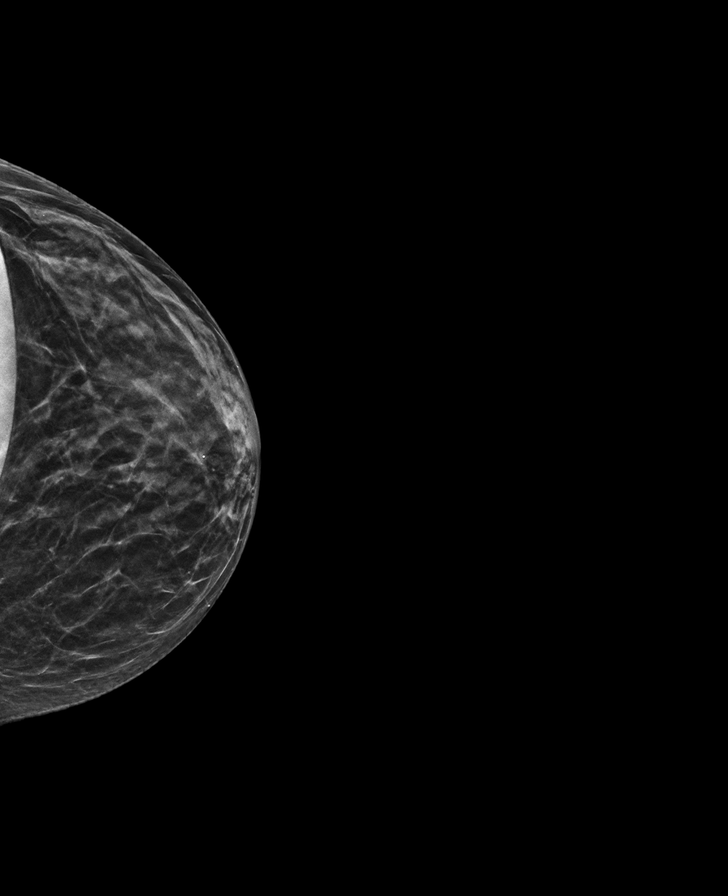

[R CC synth-2D]
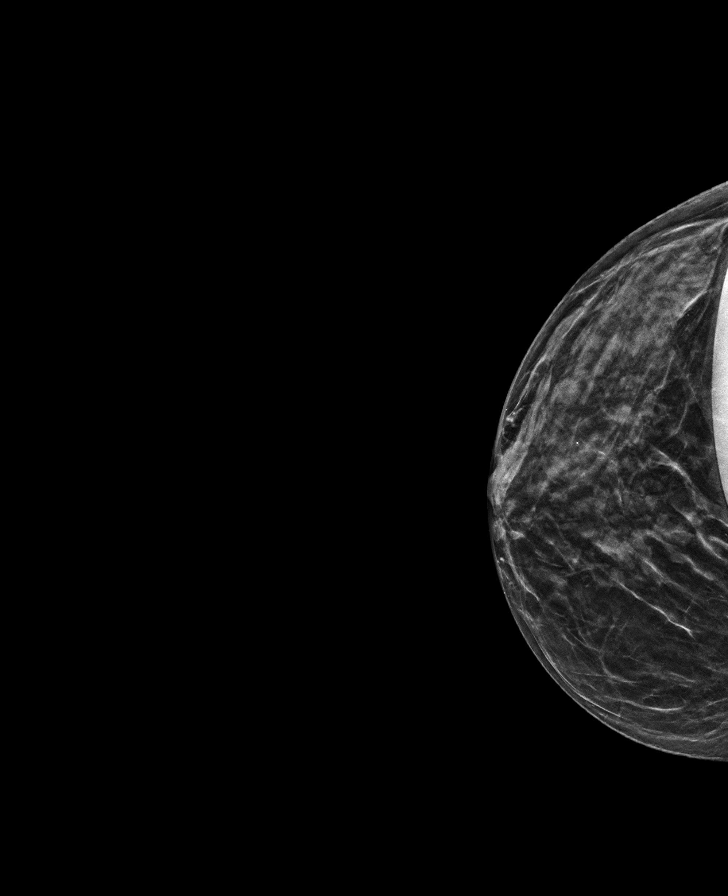

[L MLO synth-2D]
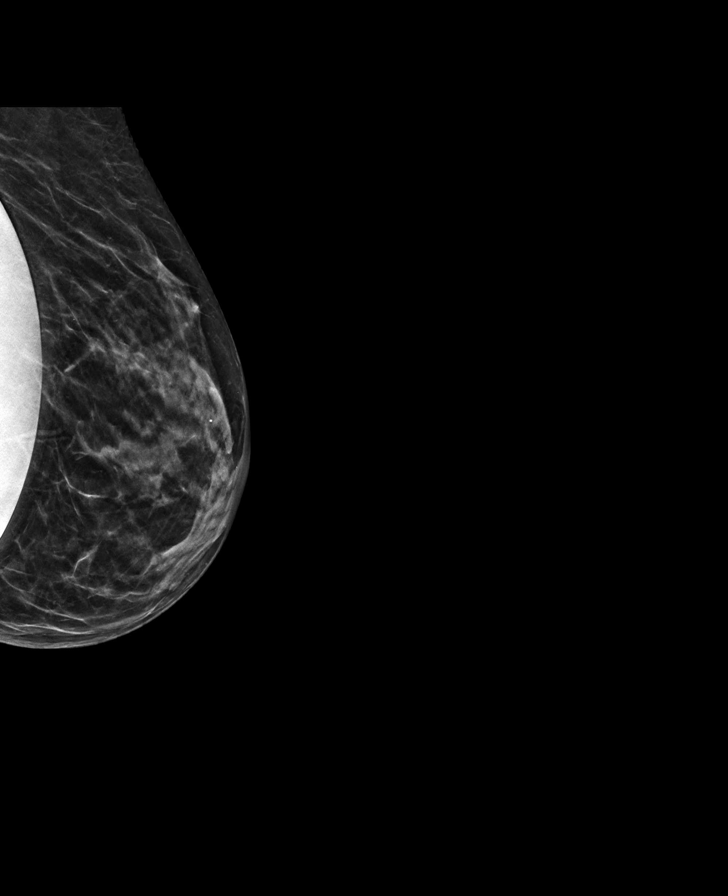

[R MLO synth-2D]
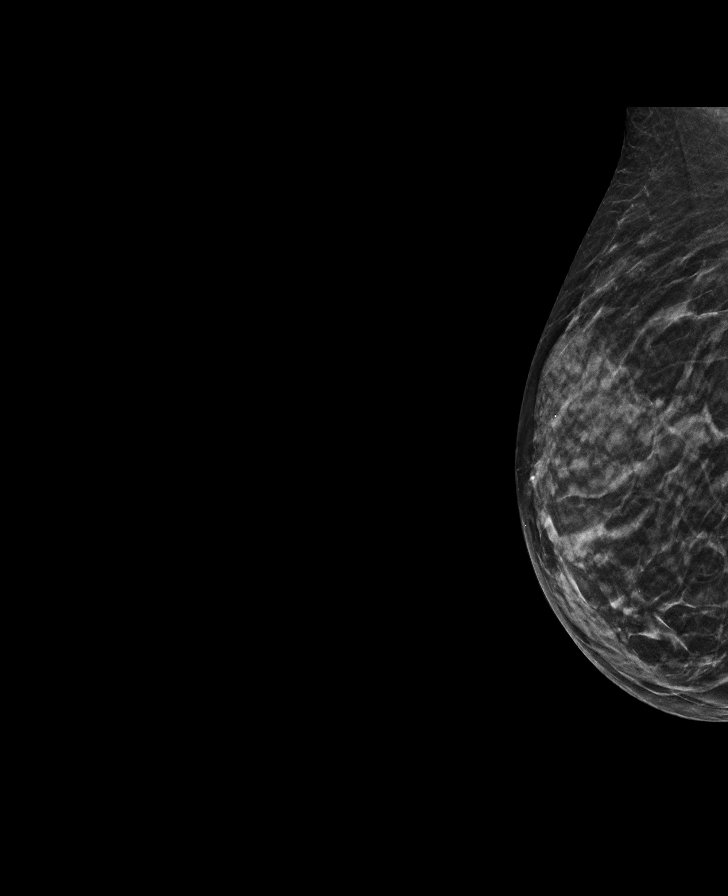

[8 of 28 positions shown; findings below may reference images not displayed]

ACR Breast Density Category c: The breast tissue is heterogeneously
dense, which may obscure small masses.
FINDINGS: The patient has retropectoral implants. There are no findings
suspicious for malignancy.
IMPRESSION: No mammographic evidence of malignancy. A result letter of this
screening mammogram will be mailed directly to the patient.

RECOMMENDATION:
Screening mammogram in one year. (Code:[LZ])

BI-RADS CATEGORY  1:  Negative.

## 2020-11-28 ENCOUNTER — Encounter: Payer: Self-pay | Admitting: *Deleted

## 2020-12-04 ENCOUNTER — Ambulatory Visit (INDEPENDENT_AMBULATORY_CARE_PROVIDER_SITE_OTHER): Payer: Commercial Managed Care - PPO | Admitting: Diagnostic Neuroimaging

## 2020-12-04 ENCOUNTER — Encounter: Payer: Self-pay | Admitting: Diagnostic Neuroimaging

## 2020-12-04 VITALS — BP 102/65 | HR 89 | Ht 63.0 in | Wt 126.0 lb

## 2020-12-04 DIAGNOSIS — F419 Anxiety disorder, unspecified: Secondary | ICD-10-CM | POA: Diagnosis not present

## 2020-12-04 DIAGNOSIS — L568 Other specified acute skin changes due to ultraviolet radiation: Secondary | ICD-10-CM

## 2020-12-04 DIAGNOSIS — R2689 Other abnormalities of gait and mobility: Secondary | ICD-10-CM | POA: Diagnosis not present

## 2020-12-04 NOTE — Progress Notes (Signed)
[facility]  PATIENT: Jeanette Ross DOB: 04/09/1965  REFERRING CLINICIAN: Courtney Paris, NP HISTORY FROM: patient  REASON FOR VISIT: new consult    HISTORICAL  CHIEF COMPLAINT:  Chief Complaint  Patient presents with   Eye Problem    RM 7 alone Pt is well, has been having bilateral blurred vision for about a month now. Has occasional imbalance.     HISTORY OF PRESENT ILLNESS:   55 year old female here for evaluation of blurred vision and balance.  Patient has had some issues with anxiety and was on increasing dose of bupropion.  Then she started to have increasing sensory light and some balance difficulties.  She has weaned off this medication has been off of it since the past 10 days.  Symptoms are intermittent but overall stable.  No headaches.   REVIEW OF SYSTEMS: Full 14 system review of systems performed and negative with exception of: as per HPI.  ALLERGIES: Allergies  Allergen Reactions   Amitiza [Lubiprostone] Itching    rash    HOME MEDICATIONS: Outpatient Medications Prior to Visit  Medication Sig Dispense Refill   Biotin 1 MG CAPS Take 1 capsule by mouth daily.     Multiple Vitamin (MULTIVITAMIN) capsule Take 1 capsule by mouth daily.     YUVAFEM 10 MCG TABS vaginal tablet      busPIRone (BUSPAR) 5 MG tablet Take 5 mg by mouth 3 (three) times daily as needed.     OZEMPIC, 0.25 OR 0.5 MG/DOSE, 2 MG/1.5ML SOPN Inject 0.25 mg as directed once a week.     No facility-administered medications prior to visit.    PAST MEDICAL HISTORY: Past Medical History:  Diagnosis Date   Anxiety    Blurred vision    Elevated ALT measurement 2020   Hypercholesterolemia    Osteoporosis 10/2020   Vertigo     PAST SURGICAL HISTORY: Past Surgical History:  Procedure Laterality Date   AUGMENTATION MAMMAPLASTY     Silicone implants   CESAREAN SECTION     x2   KNEE ARTHROSCOPY Left     FAMILY HISTORY: Family History  Problem Relation  Age of Onset   Diabetes Sister     SOCIAL HISTORY: Social History   Socioeconomic History   Marital status: Married    Spouse name: Not on file   Number of children: 2   Years of education: 18left    Highest education level: Not on file  Occupational History   Not on file  Tobacco Use   Smoking status: Never   Smokeless tobacco: Never  Vaping Use   Vaping Use: Never used  Substance and Sexual Activity   Alcohol use: Yes    Alcohol/week: 3.0 standard drinks    Types: 3 Glasses of wine per week   Drug use: Never   Sexual activity: Yes    Birth control/protection: None  Other Topics Concern   Not on file  Social History Narrative   Left handed   Two story home   Lots of coffee   Social Determinants of Health   Financial Resource Strain: Not on file  Food Insecurity: Not on file  Transportation Needs: Not on file  Physical Activity: Not on file  Stress: Not on file  Social Connections: Not on file  Intimate Partner Violence: Not on file     PHYSICAL EXAM  GENERAL EXAM/CONSTITUTIONAL: Vitals:  Vitals:   12/04/20 1359  BP: 102/65  Pulse: 89  Weight: 126 lb (57.2 kg)  Height: 5\' 3"  (1.6 m)   Body mass index is 22.32 kg/m. Wt Readings from Last 3 Encounters:  12/04/20 126 lb (57.2 kg)  05/04/20 124 lb 9.6 oz (56.5 kg)  03/18/19 134 lb (60.8 kg)   Patient is in no distress; well developed, nourished and groomed; neck is supple  CARDIOVASCULAR: Examination of carotid arteries is normal; no carotid bruits Regular rate and rhythm, no murmurs Examination of peripheral vascular system by observation and palpation is normal  EYES: Ophthalmoscopic exam of optic discs and posterior segments is normal; no papilledema or hemorrhages No results found.  MUSCULOSKELETAL: Gait, strength, tone, movements noted in Neurologic exam below  NEUROLOGIC: MENTAL STATUS:  No flowsheet data found. awake, alert, oriented to person, place and time recent and remote  memory intact normal attention and concentration language fluent, comprehension intact, naming intact fund of knowledge appropriate  CRANIAL NERVE:  2nd - no papilledema on fundoscopic exam 2nd, 3rd, 4th, 6th - pupils equal and reactive to light, visual fields full to confrontation, extraocular muscles intact, no nystagmus 5th - facial sensation symmetric 7th - facial strength symmetric 8th - hearing intact 9th - palate elevates symmetrically, uvula midline 11th - shoulder shrug symmetric 12th - tongue protrusion midline  MOTOR:  normal bulk and tone, full strength in the BUE, BLE  SENSORY:  normal and symmetric to light touch, temperature, vibration  COORDINATION:  finger-nose-finger, fine finger movements normal  REFLEXES:  deep tendon reflexes 1+ and symmetric  GAIT/STATION:  narrow based gait     DIAGNOSTIC DATA (LABS, IMAGING, TESTING) - I reviewed patient records, labs, notes, testing and imaging myself where available.  Lab Results  Component Value Date   WBC 6.6 03/03/2018   HGB 14.2 03/03/2018   HCT 40.8 03/03/2018   MCV 93 03/03/2018   PLT 319 03/03/2018      Component Value Date/Time   NA 141 03/03/2018 1432   K 4.3 03/03/2018 1432   CL 102 03/03/2018 1432   CO2 20 03/03/2018 1432   GLUCOSE 87 03/03/2018 1432   BUN 14 03/03/2018 1432   CREATININE 0.72 03/03/2018 1432   CALCIUM 9.9 03/03/2018 1432   PROT 7.0 03/03/2018 1432   ALBUMIN 4.6 03/03/2018 1432   AST 33 03/03/2018 1432   ALT 38 (H) 03/03/2018 1432   ALKPHOS 80 03/03/2018 1432   BILITOT 0.4 03/03/2018 1432   GFRNONAA 97 03/03/2018 1432   GFRAA 111 03/03/2018 1432   Lab Results  Component Value Date   CHOL 210 (H) 03/03/2018   HDL 108 03/03/2018   LDLCALC 84 03/03/2018   TRIG 92 03/03/2018   CHOLHDL 1.9 03/03/2018   No results found for: HGBA1C No results found for: VITAMINB12 Lab Results  Component Value Date   TSH 2.230 03/03/2018       ASSESSMENT AND PLAN  55 y.o.  year old adult here with:   Dx:  1. Photosensitivity   2. Anxiety   3. Imbalance      PLAN:  PHOTOSENSITIVITY / IMBALANCE (in setting of increase bupropion) - unclear etiology; could be medication side effect, anxiety or other cause - agree for patient to come off bupropion (has been 10 days so far) - consider MRI brain (patient wants to hold off for now and monitor)  ANXIETY DISORDER - refer to psychiatry  Orders Placed This Encounter  Procedures   Ambulatory referral to Psychiatry   Return for pending if symptoms worsen or fail to improve.    58, MD  12/04/2020, 2:34 PM Certified in Neurology, Neurophysiology and Neuroimaging  [facility] [address] [place], [place] [address] [phone]

## 2020-12-04 NOTE — Patient Instructions (Addendum)
  PHOTOSENSITIVITY / IMBALANCE (in setting of increase bupropion) - unclear etiology; could be medication side effect, anxiety or other cause - agree for patient to come off bupropion (has been 10 days so far) - consider MRI brain   ANXIETY DISORDER - refer to psychiatry

## 2020-12-25 ENCOUNTER — Ambulatory Visit (INDEPENDENT_AMBULATORY_CARE_PROVIDER_SITE_OTHER): Payer: Commercial Managed Care - PPO

## 2020-12-25 ENCOUNTER — Other Ambulatory Visit: Payer: Self-pay

## 2020-12-25 ENCOUNTER — Ambulatory Visit (INDEPENDENT_AMBULATORY_CARE_PROVIDER_SITE_OTHER): Payer: Commercial Managed Care - PPO | Admitting: Sports Medicine

## 2020-12-25 DIAGNOSIS — M1732 Unilateral post-traumatic osteoarthritis, left knee: Secondary | ICD-10-CM | POA: Diagnosis not present

## 2020-12-25 DIAGNOSIS — M79645 Pain in left finger(s): Secondary | ICD-10-CM | POA: Diagnosis not present

## 2020-12-25 DIAGNOSIS — M7989 Other specified soft tissue disorders: Secondary | ICD-10-CM | POA: Insufficient documentation

## 2020-12-25 DIAGNOSIS — Z09 Encounter for follow-up examination after completed treatment for conditions other than malignant neoplasm: Secondary | ICD-10-CM

## 2020-12-25 IMAGING — DX DG KNEE COMPLETE 4+V*L*
4 series · 4 of 4 positions shown · non-contrast
Comparison: None.

CLINICAL DATA: Posttraumatic osteoarthritis of left knee. ACL
reconstruction.

EXAM:
LEFT KNEE - COMPLETE 4+ VIEW; RIGHT KNEE - 1-2 VIEW

[knee tunnel]
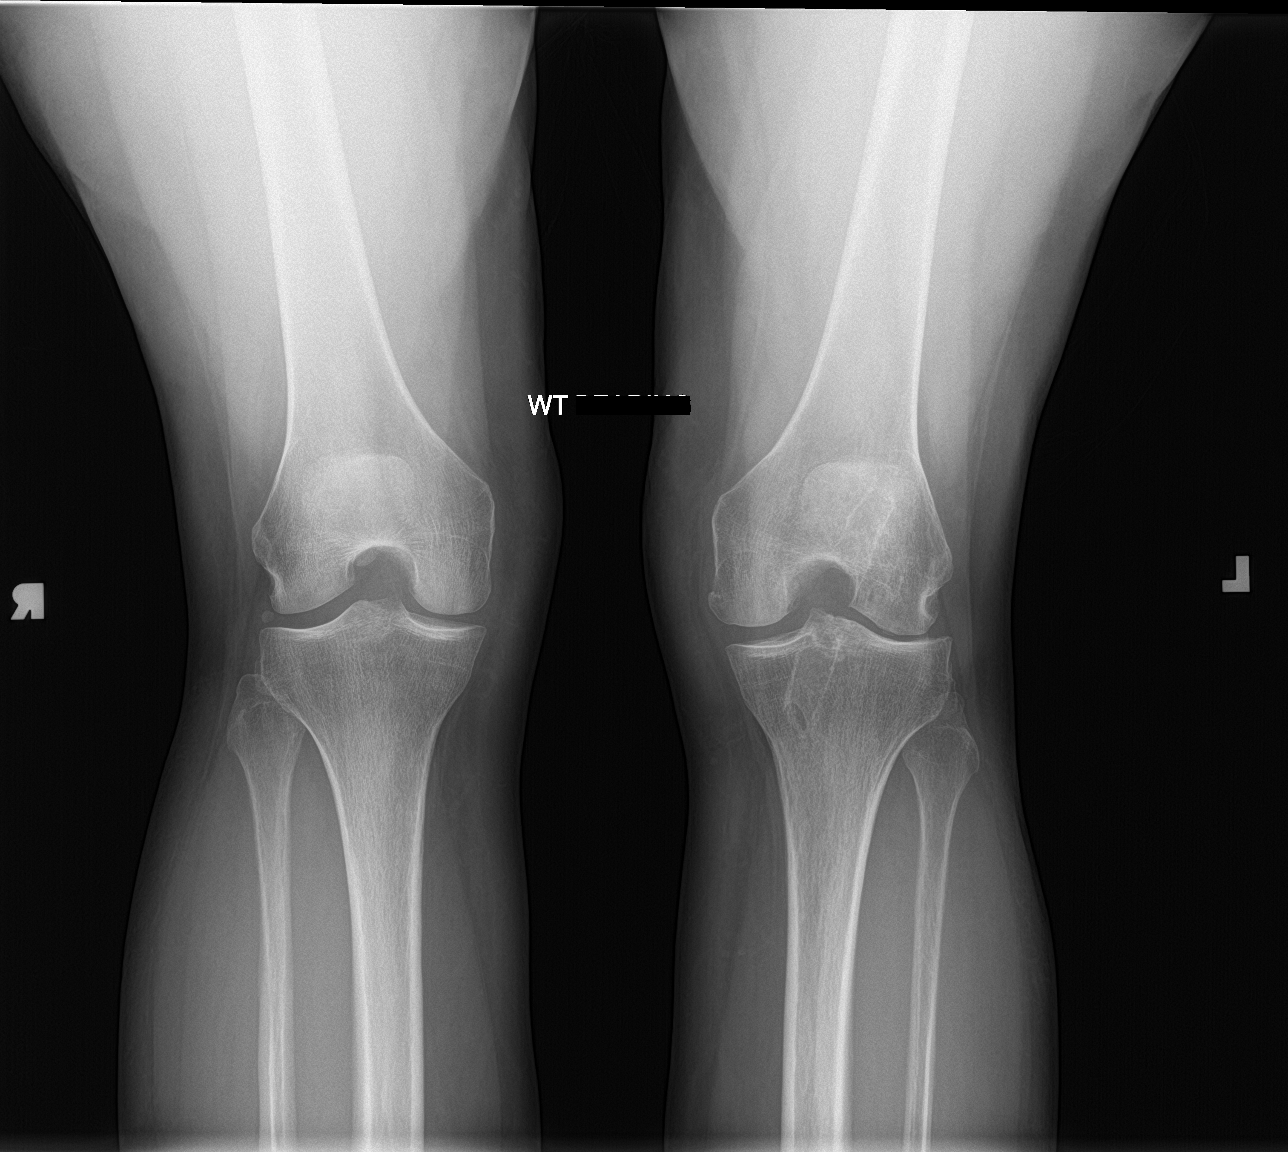

[knee lat]
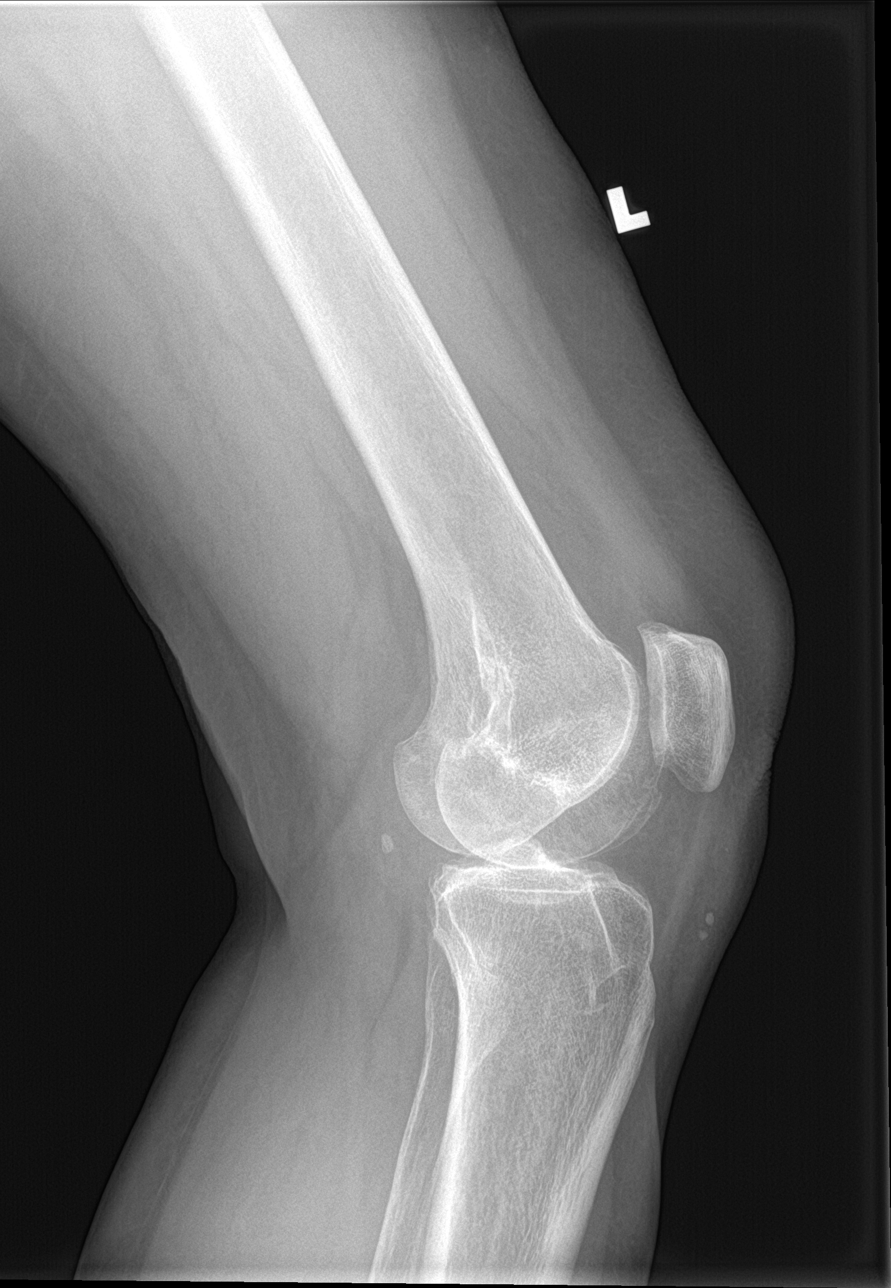

[knee ap bilat standing]
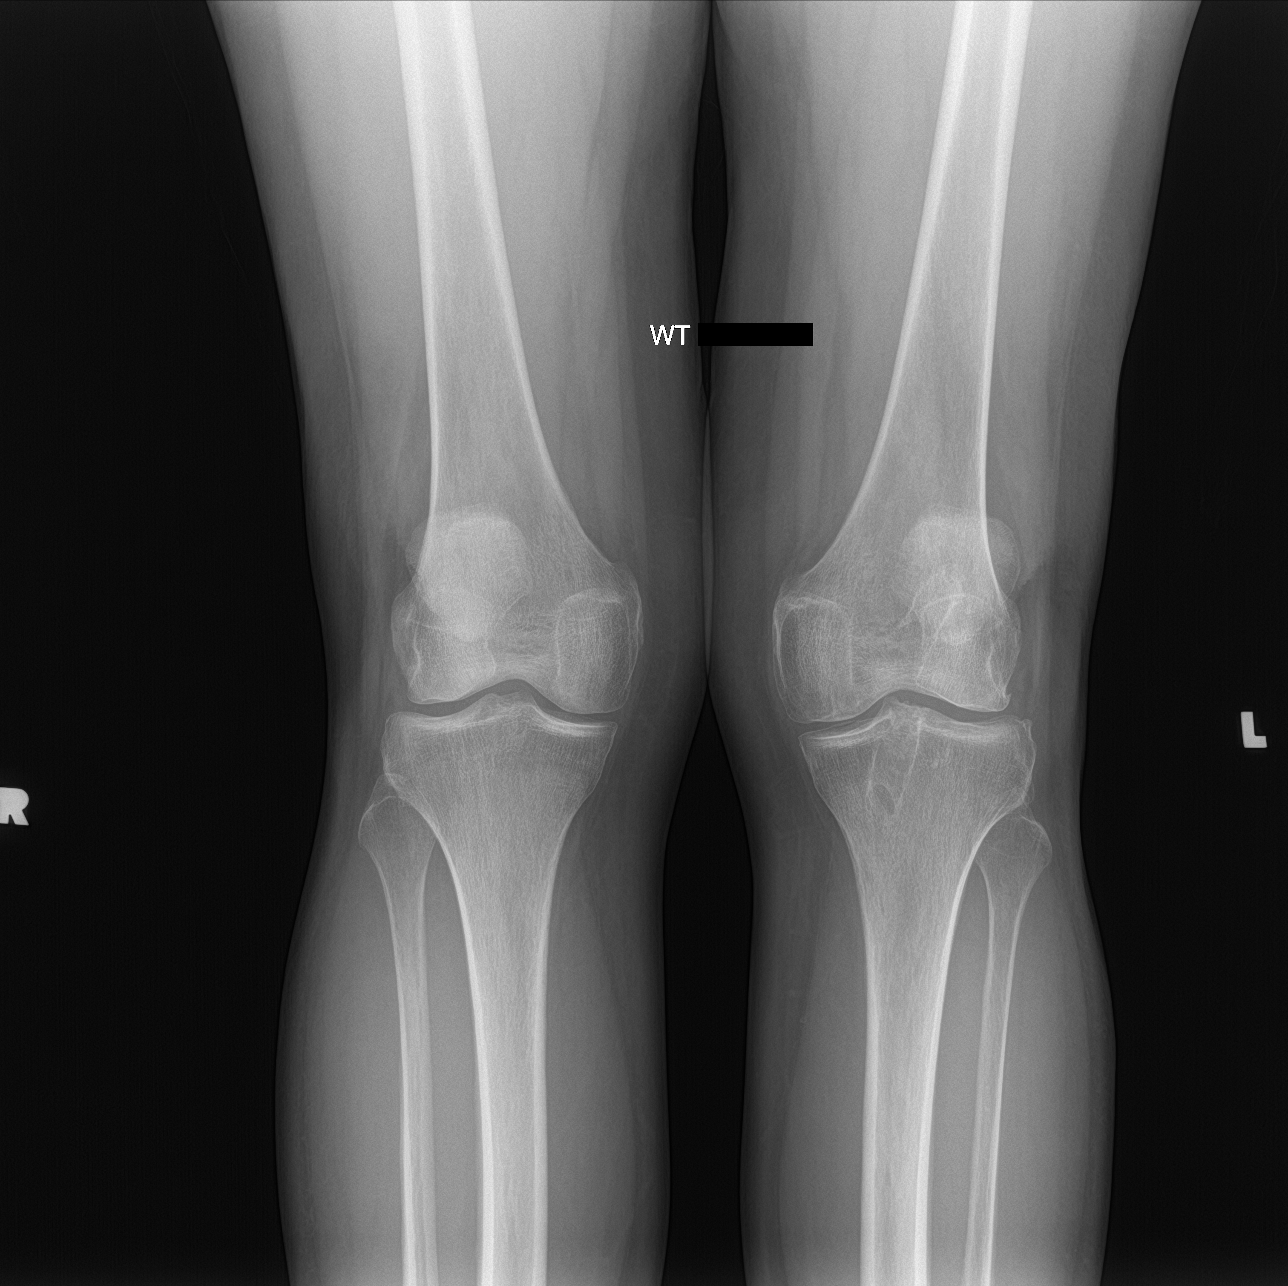

[knee sunrise standing]
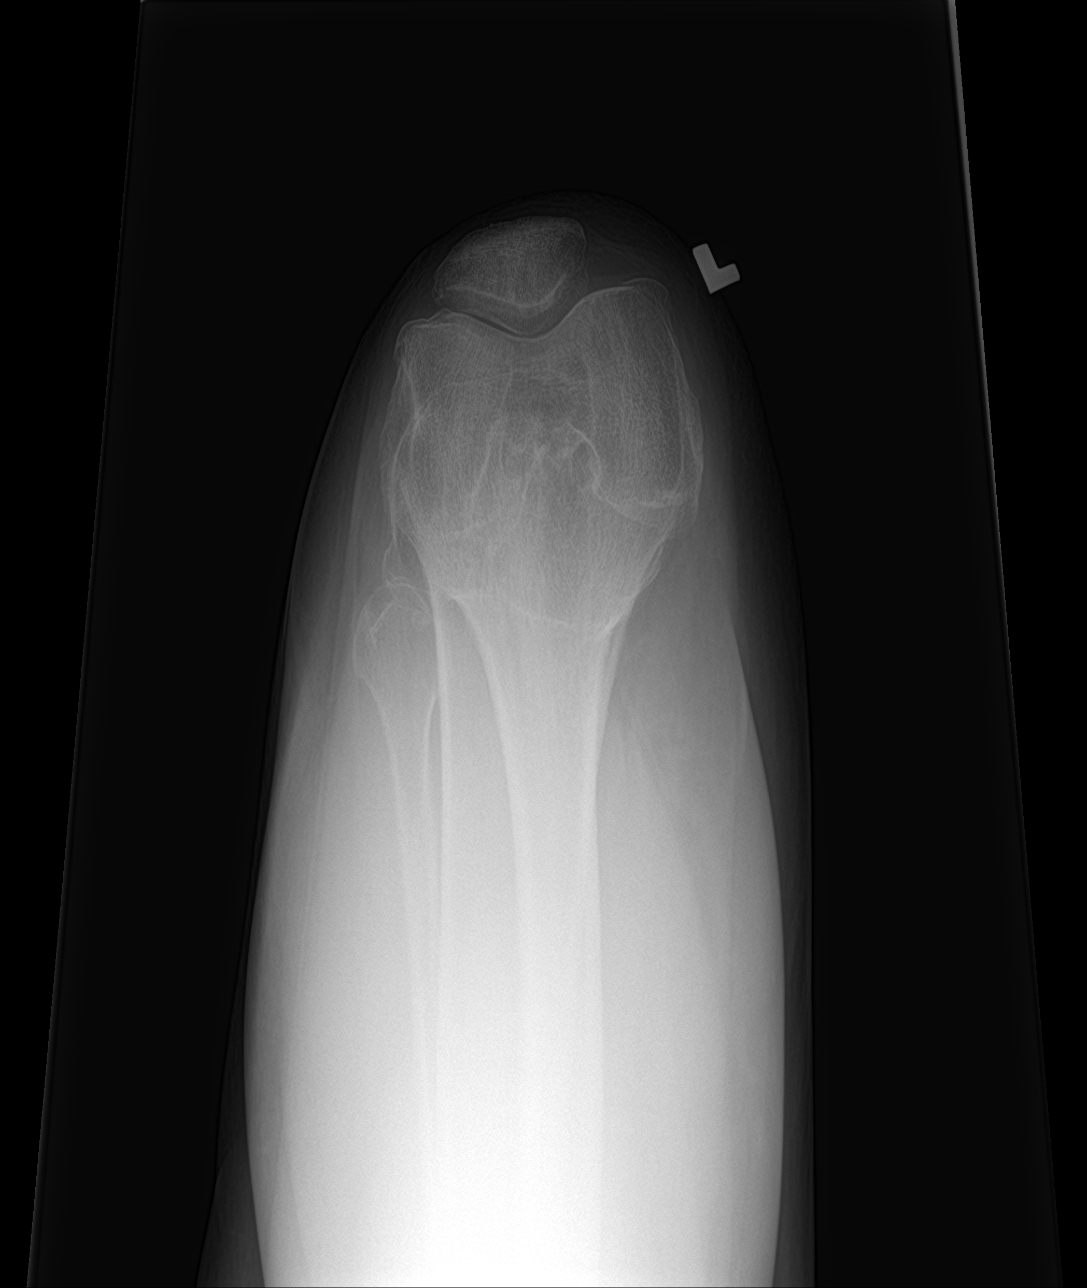

[4 of 4 positions shown; findings below may reference images not displayed]

FINDINGS: Right knee: There is no evidence for fracture or dislocation. Joint
spaces are maintained. No focal osseous lesions are identified. Soft
tissues are within normal limits.

Left knee: Patient is status post ACL repair. There is no acute
fracture or dislocation. There is mild lateral compartment joint
space narrowing. There is mild tricompartmental osteophyte
formation. Soft tissues are within normal limits.
IMPRESSION: 1. Postsurgical changes in the left knee with mild tricompartmental
osteoarthrosis.
2. Right knee within normal limits.

## 2020-12-25 IMAGING — DX DG KNEE 1-2V*R*
2 series · 2 of 2 positions shown · non-contrast
Comparison: None.

CLINICAL DATA: Posttraumatic osteoarthritis of left knee. ACL
reconstruction.

EXAM:
LEFT KNEE - COMPLETE 4+ VIEW; RIGHT KNEE - 1-2 VIEW

[knee tunnel]
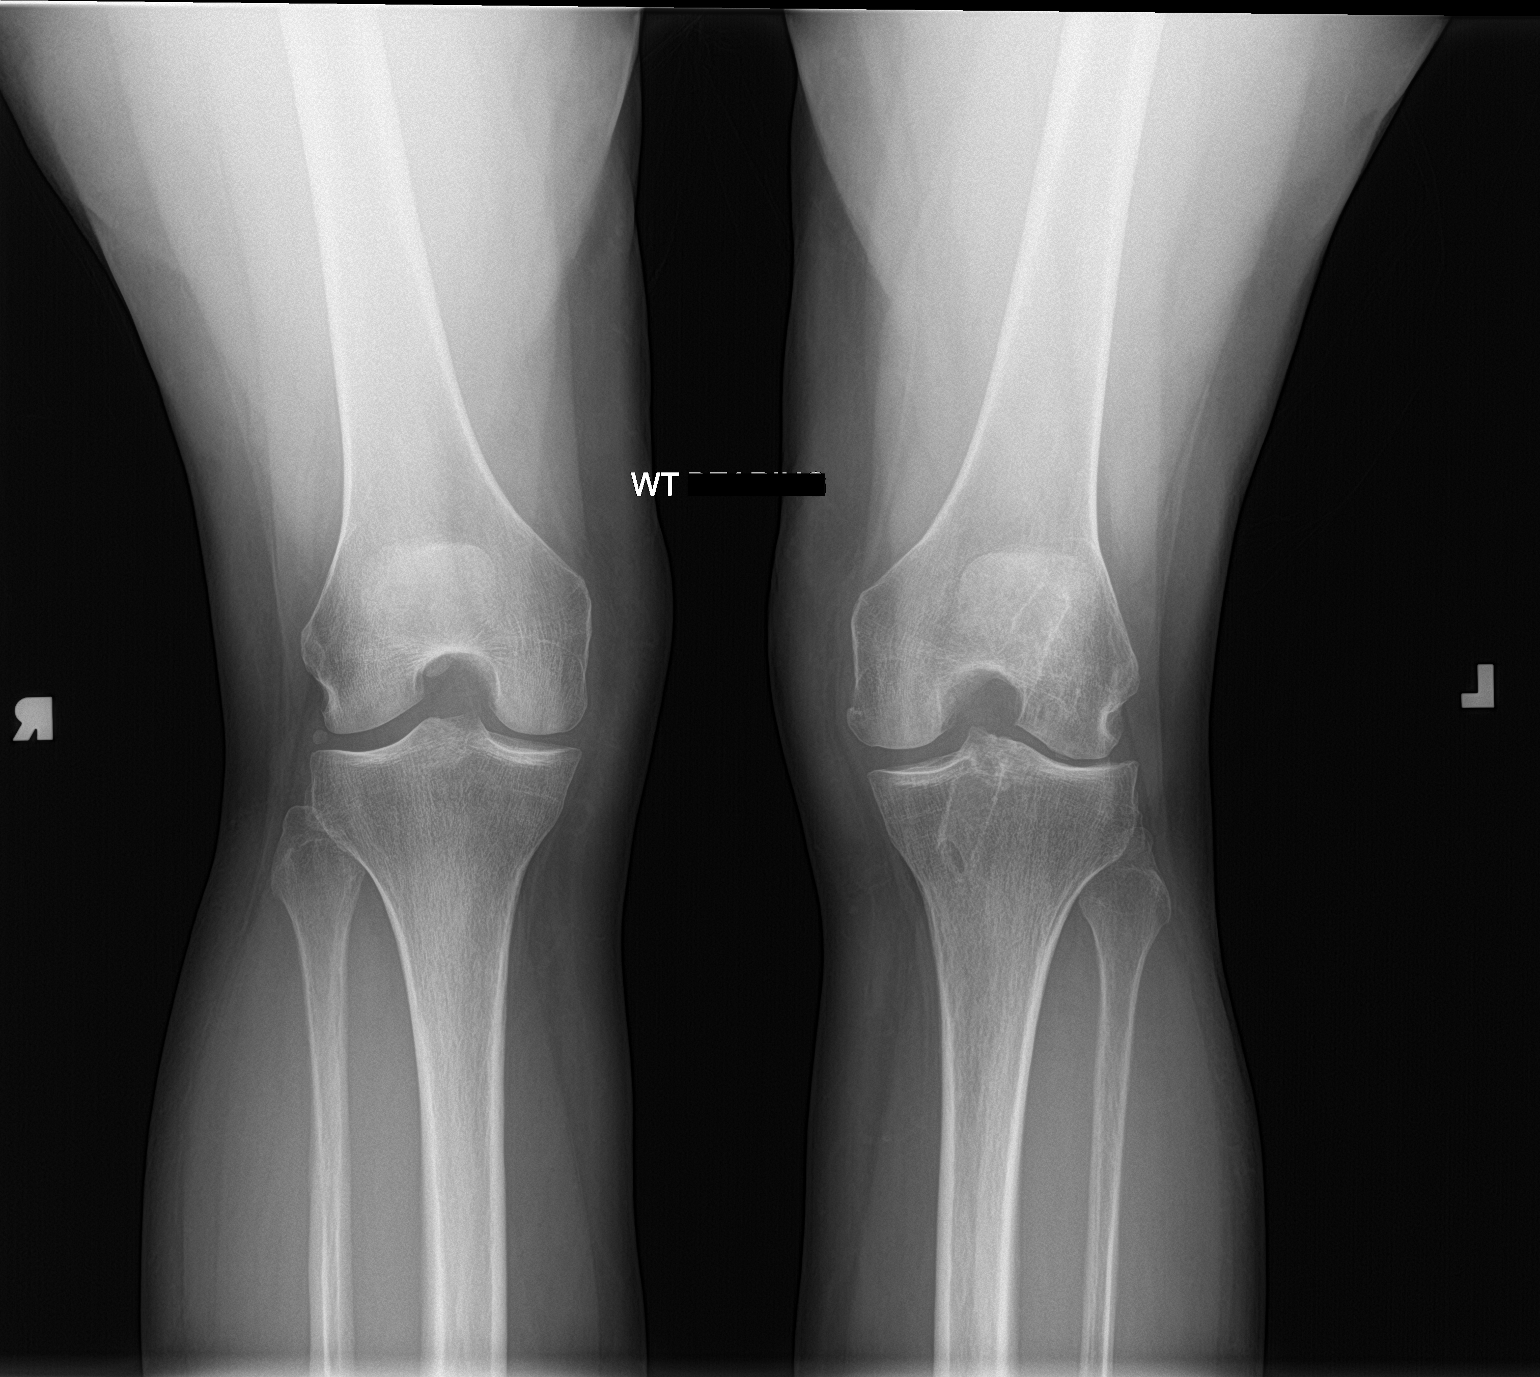

[knee ap bilat standing]
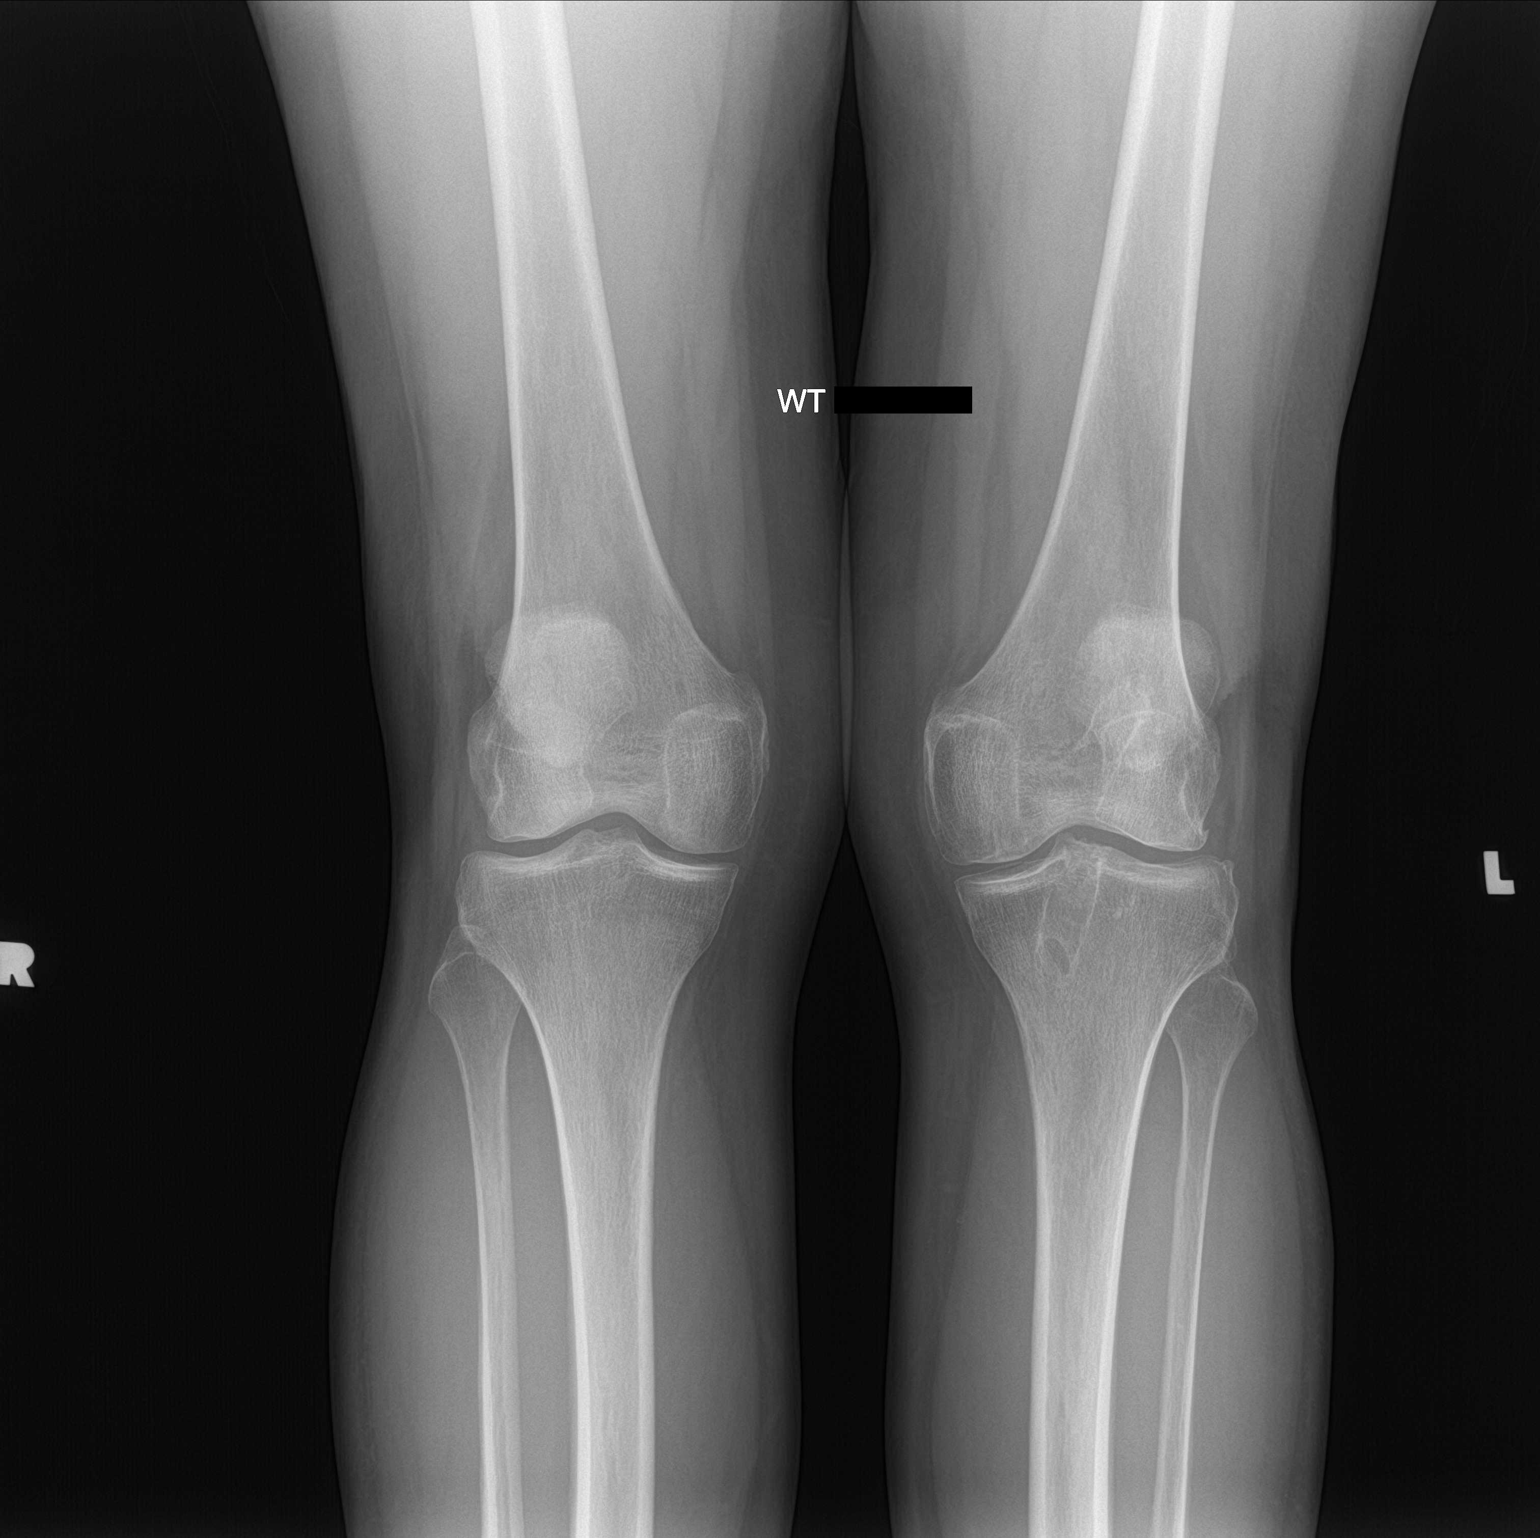

[2 of 2 positions shown; findings below may reference images not displayed]

FINDINGS: Right knee: There is no evidence for fracture or dislocation. Joint
spaces are maintained. No focal osseous lesions are identified. Soft
tissues are within normal limits.

Left knee: Patient is status post ACL repair. There is no acute
fracture or dislocation. There is mild lateral compartment joint
space narrowing. There is mild tricompartmental osteophyte
formation. Soft tissues are within normal limits.
IMPRESSION: 1. Postsurgical changes in the left knee with mild tricompartmental
osteoarthrosis.
2. Right knee within normal limits.

## 2020-12-25 IMAGING — DX DG FINGER THUMB 2+V*L*
3 series · 3 of 3 positions shown · non-contrast
Comparison: None

CLINICAL DATA: Pain and swelling

EXAM:
LEFT THUMB 2+V

[finger ap]
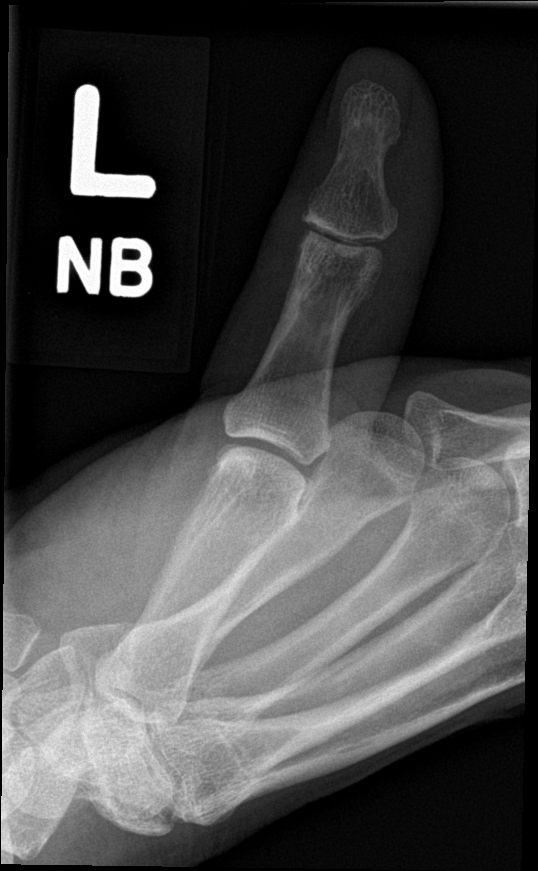

[finger obl]
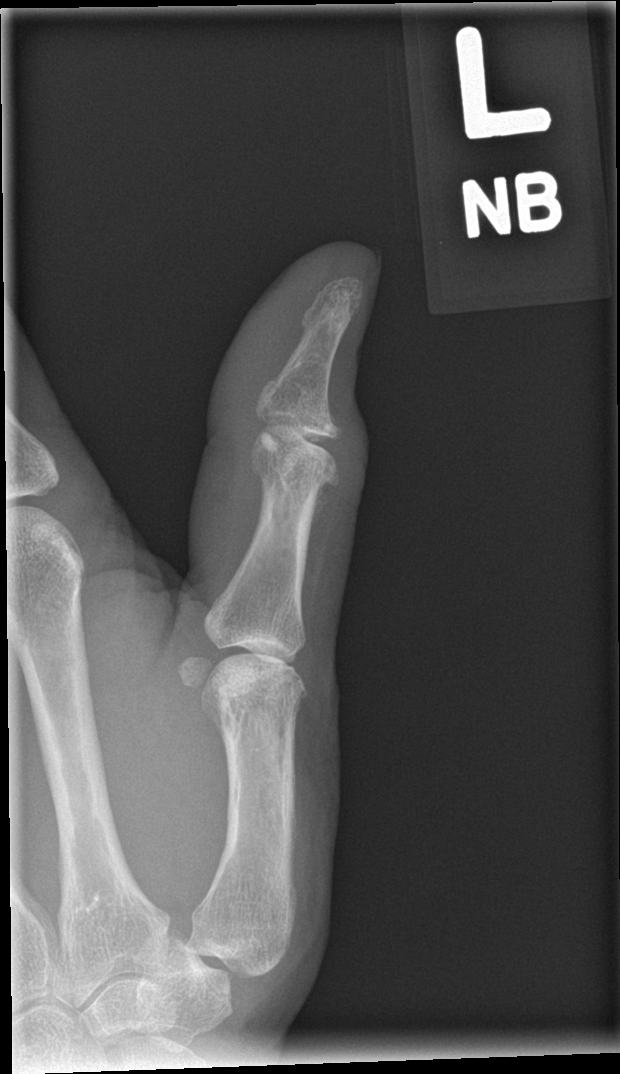

[finger lat]
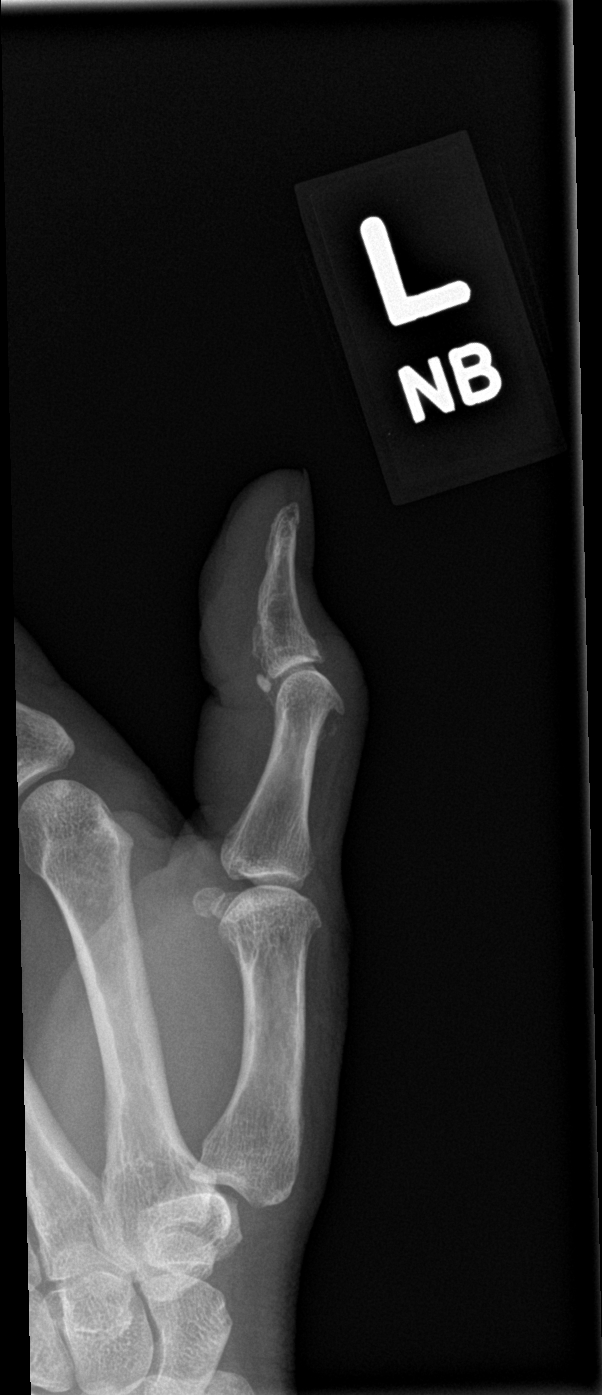

[3 of 3 positions shown; findings below may reference images not displayed]

FINDINGS: No recent fracture or dislocation is seen. Degenerative changes are
noted with bony spurs in the interphalangeal joint. There is soft
tissue swelling over the interphalangeal joint. There are small bony
spurs in the first carpometacarpal and first metacarpophalangeal
joints.
IMPRESSION: No recent fracture or dislocation is seen. Degenerative changes are
noted in multiple joints, more prominent in the interphalangeal
joint of the left thumb.

## 2020-12-25 MED ORDER — CELECOXIB 200 MG PO CAPS
ORAL_CAPSULE | ORAL | 2 refills | Status: AC
Start: 2020-12-25 — End: ?

## 2020-12-25 NOTE — Assessment & Plan Note (Signed)
There is also moderate to severe fusiform swelling and pain in the left first interphalangeal joint, no trauma, present for a few weeks. No other joint swelling, no family history of rheumatoid arthritis. We will get some x-rays, treat this conservatively at first and if insufficient improvement we will consider injection +/- rheumatoid testing.

## 2020-12-25 NOTE — Assessment & Plan Note (Signed)
This is a very pleasant 55 year old female, she has a history of an ACL reconstruction, she has been having pain in the left knee, lateral joint line, gelling, no mechanical symptoms, this is likely posttraumatic osteoarthritis as is typical post ACL reconstruction. Adding Celebrex, updated x-rays, knee conditioning exercises, return in 6 weeks.

## 2020-12-25 NOTE — Progress Notes (Signed)
    Procedures performed today:    None.  Independent interpretation of notes and tests performed by another provider:   None.  Brief History, Exam, Impression, and Recommendations:    Post-traumatic osteoarthritis of left knee This is a very pleasant 55 year old female, she has a history of an ACL reconstruction, she has been having pain in the left knee, lateral joint line, gelling, no mechanical symptoms, this is likely posttraumatic osteoarthritis as is typical post ACL reconstruction. Adding Celebrex, updated x-rays, knee conditioning exercises, return in 6 weeks.  Swelling of thumb, left There is also moderate to severe fusiform swelling and pain in the left first interphalangeal joint, no trauma, present for a few weeks. No other joint swelling, no family history of rheumatoid arthritis. We will get some x-rays, treat this conservatively at first and if insufficient improvement we will consider injection +/- rheumatoid testing.    ___________________________________________ Ihor Austin. Benjamin Stain, M.D., ABFM., CAQSM. Primary Care and Sports Medicine [facility]  Adjunct Instructor of Family Medicine  [employer]

## 2021-02-18 ENCOUNTER — Ambulatory Visit (HOSPITAL_COMMUNITY): Payer: Self-pay | Admitting: Psychiatry

## 2021-02-19 ENCOUNTER — Ambulatory Visit: Payer: Commercial Managed Care - PPO | Admitting: Sports Medicine

## 2021-02-21 ENCOUNTER — Ambulatory Visit (HOSPITAL_COMMUNITY): Payer: Self-pay | Admitting: Psychiatry

## 2021-02-26 ENCOUNTER — Encounter: Payer: Self-pay | Admitting: Family Medicine

## 2021-02-26 ENCOUNTER — Ambulatory Visit (INDEPENDENT_AMBULATORY_CARE_PROVIDER_SITE_OTHER): Payer: Commercial Managed Care - PPO | Admitting: Family Medicine

## 2021-02-26 ENCOUNTER — Other Ambulatory Visit: Payer: Self-pay

## 2021-02-26 VITALS — BP 119/66 | HR 84 | Temp 97.8°F | Ht 63.0 in | Wt 126.0 lb

## 2021-02-26 DIAGNOSIS — F411 Generalized anxiety disorder: Secondary | ICD-10-CM

## 2021-02-26 DIAGNOSIS — R7303 Prediabetes: Secondary | ICD-10-CM | POA: Diagnosis not present

## 2021-02-26 DIAGNOSIS — Z13220 Encounter for screening for lipoid disorders: Secondary | ICD-10-CM

## 2021-02-26 DIAGNOSIS — N951 Menopausal and female climacteric states: Secondary | ICD-10-CM | POA: Diagnosis not present

## 2021-02-26 MED ORDER — VORTIOXETINE HBR 10 MG PO TABS: 10.0000 mg | ORAL_TABLET | Freq: Every day | ORAL | 3 refills | Status: AC

## 2021-02-26 NOTE — Assessment & Plan Note (Signed)
She is seeing OB/GYN for management of this.

## 2021-02-26 NOTE — Assessment & Plan Note (Signed)
We discussed options for management of her anxiety.  Adding Trintellix as she is concerned about weight gain with other medications.  This also has minimal sexual side effects.  She has tried sertraline, Lexapro and most recently bupropion without improvement.  We can consider addition of BuSpar as there is some evidence that shows that this may help with libido as well as management of her anxiety.

## 2021-02-26 NOTE — Assessment & Plan Note (Signed)
Update A1c, renal function and lipid panel.

## 2021-02-26 NOTE — Progress Notes (Signed)
Jeanette Ross - 56 y.o. adult MRN [id number]  Date of birth: Jan 10, 1966  Subjective No chief complaint on file.   HPI Jeanette Ross is a 56 year old female here today for initial visit to establish care.  She has history of prediabetes as well as menopausal symptoms.  She has had some anxiety related to menopause.  She does have some mild vasomotor symptoms as well as decreased libido.  She is seeing gynecology who has prescribed bupropion and Yuvafem.  This has not really helped her dyspareunia and libido.  She also feels like her anxiety has worsened some since adding bupropion so she has discontinued this.  She reports that her home life is good and she denies any significant stress related to work.  She does work as an Art gallery manager with [employer] but reports only typical stressors related to her job.  ROS:  A comprehensive ROS was completed and negative except as noted per HPI  Allergies  Allergen Reactions   Amitiza [Lubiprostone] Itching    rash    Past Medical History:  Diagnosis Date   Anxiety    Blurred vision    Elevated ALT measurement 2020   Hypercholesterolemia    Osteoporosis 10/2020   Vertigo     Past Surgical History:  Procedure Laterality Date   AUGMENTATION MAMMAPLASTY     Silicone implants   CESAREAN SECTION     x2   KNEE ARTHROSCOPY Left     Social History   Socioeconomic History   Marital status: Married    Spouse name: Not on file   Number of children: 2   Years of education: 18left    Highest education level: Not on file  Occupational History   Not on file  Tobacco Use   Smoking status: Never   Smokeless tobacco: Never  Vaping Use   Vaping Use: Never used  Substance and Sexual Activity   Alcohol use: Yes    Alcohol/week: 3.0 standard drinks    Types: 3 Glasses of wine per week   Drug use: Never   Sexual activity: Yes    Birth control/protection: None  Other Topics Concern   Not on file  Social History Narrative   Left handed   Two  story home   Lots of coffee   Social Determinants of Health   Financial Resource Strain: Not on file  Food Insecurity: Not on file  Transportation Needs: Not on file  Physical Activity: Not on file  Stress: Not on file  Social Connections: Not on file    Family History  Problem Relation Age of Onset   Diabetes Sister     Health Maintenance  Topic Date Due   COVID-19 Vaccine (1) Never done   HIV Screening  Never done   Hepatitis C Screening  Never done   PAP SMEAR-Modifier  Never done   COLONOSCOPY (Pts 45-24yrs Insurance coverage will need to be confirmed)  Never done   Zoster Vaccines- Shingrix (1 of 2) Never done   INFLUENZA VACCINE  Never done   MAMMOGRAM  11/24/2022   TETANUS/TDAP  03/03/2028   Pneumococcal Vaccine 27-17 Years old  Aged Out   HPV VACCINES  Aged Out     ----------------------------------------------------------------------------------------------------------------------------------------------------------------------------------------------------------------- Physical Exam BP 119/66 (BP Location: Left Arm, Patient Position: Sitting, Cuff Size: Small)    Pulse 84    Temp 97.8 F (36.6 C)    Ht 5\' 3"  (1.6 m)    Wt 126 lb (57.2 kg)    LMP 12/11/2017  SpO2 99%    BMI 22.32 kg/m   Physical Exam Constitutional:      Appearance: Normal appearance.  Eyes:     General: No scleral icterus. Cardiovascular:     Rate and Rhythm: Normal rate and regular rhythm.  Pulmonary:     Effort: Pulmonary effort is normal.     Breath sounds: Normal breath sounds.  Musculoskeletal:     Cervical back: Neck supple.  Neurological:     Mental Status: She is alert.  Psychiatric:        Mood and Affect: Mood normal.        Behavior: Behavior normal.     ------------------------------------------------------------------------------------------------------------------------------------------------------------------------------------------------------------------- Assessment and Plan  Generalized anxiety disorder We discussed options for management of her anxiety.  Adding Trintellix as she is concerned about weight gain with other medications.  This also has minimal sexual side effects.  She has tried sertraline, Lexapro and most recently bupropion without improvement.  We can consider addition of BuSpar as there is some evidence that shows that this may help with libido as well as management of her anxiety.  Vasomotor symptoms due to menopause She is seeing OB/GYN for management of this.  Prediabetes Update A1c, renal function and lipid panel.   Meds ordered this encounter  Medications   vortioxetine HBr (TRINTELLIX) 10 MG TABS tablet    Sig: Take 1 tablet (10 mg total) by mouth daily.    Dispense:  30 tablet    Refill:  3    Return in about 6 weeks (around 04/09/2021) for GAD.    This visit occurred during the SARS-CoV-2 public health emergency.  Safety protocols were in place, including screening questions prior to the visit, additional usage of staff PPE, and extensive cleaning of exam room while observing appropriate contact time as indicated for disinfecting solutions.

## 2021-03-01 ENCOUNTER — Encounter: Payer: Self-pay | Admitting: Family Medicine

## 2021-03-01 NOTE — Telephone Encounter (Signed)
See other MyChart message

## 2021-03-04 ENCOUNTER — Ambulatory Visit: Payer: Commercial Managed Care - PPO | Admitting: Sports Medicine

## 2021-04-08 ENCOUNTER — Ambulatory Visit: Payer: Commercial Managed Care - PPO | Admitting: Family Medicine

## 2021-05-06 ENCOUNTER — Ambulatory Visit: Payer: Commercial Managed Care - PPO | Admitting: Family Medicine

## 2023-10-13 ENCOUNTER — Encounter: Payer: Self-pay | Admitting: Sports Medicine
